# Patient Record
Sex: Male | Born: 1965
Health system: Southern US, Community
[De-identification: ages and names within clinical notes are randomized; demographics above are authoritative.]

## PROBLEM LIST (undated history)

## (undated) ENCOUNTER — Emergency Department: Admission: EM | Source: Home / Self Care

## (undated) DIAGNOSIS — M109 Gout, unspecified: Secondary | ICD-10-CM

## (undated) DIAGNOSIS — I10 Essential (primary) hypertension: Secondary | ICD-10-CM

## (undated) HISTORY — PX: CERVICAL DISC SURGERY: SHX588

## (undated) HISTORY — PX: TONSILLECTOMY AND ADENOIDECTOMY: SUR1326

---

## 2004-11-07 ENCOUNTER — Ambulatory Visit: Payer: Self-pay | Admitting: Orthopedic Surgery

## 2005-07-20 ENCOUNTER — Emergency Department: Payer: Self-pay | Admitting: Unknown Physician Specialty

## 2005-07-20 ENCOUNTER — Other Ambulatory Visit: Payer: Self-pay

## 2007-11-25 DIAGNOSIS — R002 Palpitations: Secondary | ICD-10-CM | POA: Insufficient documentation

## 2007-11-25 DIAGNOSIS — I10 Essential (primary) hypertension: Secondary | ICD-10-CM | POA: Insufficient documentation

## 2007-11-25 DIAGNOSIS — Z720 Tobacco use: Secondary | ICD-10-CM | POA: Insufficient documentation

## 2007-11-25 DIAGNOSIS — F17200 Nicotine dependence, unspecified, uncomplicated: Secondary | ICD-10-CM | POA: Insufficient documentation

## 2009-07-21 DIAGNOSIS — D1730 Benign lipomatous neoplasm of skin and subcutaneous tissue of unspecified sites: Secondary | ICD-10-CM | POA: Insufficient documentation

## 2010-01-24 ENCOUNTER — Ambulatory Visit: Payer: Self-pay | Admitting: Family Medicine

## 2010-01-25 DIAGNOSIS — M5030 Other cervical disc degeneration, unspecified cervical region: Secondary | ICD-10-CM | POA: Insufficient documentation

## 2012-07-22 ENCOUNTER — Emergency Department: Payer: Self-pay | Admitting: Emergency Medicine

## 2012-11-14 ENCOUNTER — Ambulatory Visit: Payer: Self-pay | Admitting: Family

## 2013-01-30 ENCOUNTER — Ambulatory Visit: Payer: Self-pay | Admitting: Neurology

## 2013-02-12 ENCOUNTER — Ambulatory Visit: Payer: Self-pay | Admitting: Neurology

## 2013-02-18 ENCOUNTER — Ambulatory Visit: Payer: Self-pay | Admitting: Neurology

## 2013-03-27 ENCOUNTER — Ambulatory Visit: Payer: Self-pay | Admitting: Neurology

## 2013-05-02 ENCOUNTER — Emergency Department: Payer: Self-pay | Admitting: Emergency Medicine

## 2013-07-31 ENCOUNTER — Ambulatory Visit: Payer: Self-pay | Admitting: Family Medicine

## 2014-06-23 DIAGNOSIS — E538 Deficiency of other specified B group vitamins: Secondary | ICD-10-CM | POA: Insufficient documentation

## 2014-06-23 DIAGNOSIS — G479 Sleep disorder, unspecified: Secondary | ICD-10-CM | POA: Insufficient documentation

## 2014-07-09 ENCOUNTER — Emergency Department: Payer: Self-pay | Admitting: Emergency Medicine

## 2014-07-09 LAB — URINALYSIS, COMPLETE
Bacteria: NONE SEEN
Bilirubin,UR: NEGATIVE
Glucose,UR: NEGATIVE mg/dL (ref 0–75)
KETONE: NEGATIVE
Leukocyte Esterase: NEGATIVE
Nitrite: NEGATIVE
PROTEIN: NEGATIVE
Ph: 5 (ref 4.5–8.0)
SPECIFIC GRAVITY: 1.014 (ref 1.003–1.030)
SQUAMOUS EPITHELIAL: NONE SEEN

## 2014-12-25 LAB — CBC AND DIFFERENTIAL
HCT: 43 % (ref 41–53)
Hemoglobin: 4.7 g/dL — AB (ref 13.5–17.5)
NEUTROS ABS: 66 /uL
PLATELETS: 261 10*3/uL (ref 150–399)
WBC: 11.9 10^3/mL

## 2015-02-05 ENCOUNTER — Other Ambulatory Visit: Payer: Self-pay | Admitting: Internal Medicine

## 2015-02-05 DIAGNOSIS — D3501 Benign neoplasm of right adrenal gland: Secondary | ICD-10-CM

## 2015-03-25 ENCOUNTER — Other Ambulatory Visit: Payer: Self-pay | Admitting: Family Medicine

## 2015-03-28 ENCOUNTER — Other Ambulatory Visit: Payer: Self-pay | Admitting: Family Medicine

## 2015-03-30 ENCOUNTER — Ambulatory Visit: Admission: RE | Admit: 2015-03-30 | Payer: Self-pay | Source: Ambulatory Visit

## 2015-04-13 ENCOUNTER — Ambulatory Visit
Admission: RE | Admit: 2015-04-13 | Discharge: 2015-04-13 | Disposition: A | Discharge status: Home / Self Care | Payer: BLUE CROSS/BLUE SHIELD | Source: Ambulatory Visit | Attending: Internal Medicine | Admitting: Internal Medicine

## 2015-04-13 DIAGNOSIS — D3501 Benign neoplasm of right adrenal gland: Secondary | ICD-10-CM | POA: Diagnosis not present

## 2015-07-28 ENCOUNTER — Other Ambulatory Visit: Payer: Self-pay | Admitting: Family Medicine

## 2015-11-02 ENCOUNTER — Other Ambulatory Visit: Payer: Self-pay | Admitting: Family Medicine

## 2015-11-02 NOTE — Telephone Encounter (Signed)
Pt contacted office for refill request on the following medications: atenolol (TENORMIN) 25 MG tablet & lisinopril (PRINIVIL,ZESTRIL) 20 MG tablet to [facility]. Pt stated after he takes the medication tonight he will be out and would like the RX sent today. I advised that refills can take 24 to 48 hours and Simona Huh is out of the office on Wednesdays. Pt stated that [facility] advised him they sent this before today and we didn't respond. I advised we received it this afternoon for the first time. Thanks TNP

## 2015-12-04 ENCOUNTER — Other Ambulatory Visit: Payer: Self-pay | Admitting: Family Medicine

## 2015-12-15 ENCOUNTER — Other Ambulatory Visit: Payer: Self-pay | Admitting: Internal Medicine

## 2015-12-15 DIAGNOSIS — E279 Disorder of adrenal gland, unspecified: Principal | ICD-10-CM

## 2015-12-15 DIAGNOSIS — E278 Other specified disorders of adrenal gland: Secondary | ICD-10-CM

## 2015-12-19 ENCOUNTER — Emergency Department
Admission: EM | Admit: 2015-12-19 | Discharge: 2015-12-19 | Disposition: A | Discharge status: Home / Self Care | Payer: BLUE CROSS/BLUE SHIELD | Attending: Emergency Medicine | Admitting: Emergency Medicine

## 2015-12-19 ENCOUNTER — Encounter: Payer: Self-pay | Admitting: Emergency Medicine

## 2015-12-19 DIAGNOSIS — F17200 Nicotine dependence, unspecified, uncomplicated: Secondary | ICD-10-CM | POA: Diagnosis not present

## 2015-12-19 DIAGNOSIS — M10071 Idiopathic gout, right ankle and foot: Secondary | ICD-10-CM | POA: Diagnosis not present

## 2015-12-19 DIAGNOSIS — Z79899 Other long term (current) drug therapy: Secondary | ICD-10-CM | POA: Insufficient documentation

## 2015-12-19 DIAGNOSIS — I10 Essential (primary) hypertension: Secondary | ICD-10-CM | POA: Diagnosis not present

## 2015-12-19 DIAGNOSIS — M79674 Pain in right toe(s): Secondary | ICD-10-CM | POA: Diagnosis present

## 2015-12-19 HISTORY — DX: Gout, unspecified: M10.9

## 2015-12-19 HISTORY — DX: Essential (primary) hypertension: I10

## 2015-12-19 MED ORDER — COLCHICINE 0.6 MG PO TABS: 0.6000 mg | ORAL_TABLET | Freq: Every day | ORAL | Status: DC

## 2015-12-19 MED ORDER — KETOROLAC TROMETHAMINE 60 MG/2ML IM SOLN
60.0000 mg | Freq: Once | INTRAMUSCULAR | Status: AC
  Administered 2015-12-19: 60 mg via INTRAMUSCULAR
  Filled 2015-12-19: qty 2

## 2015-12-19 MED ORDER — HYDROMORPHONE HCL 1 MG/ML IJ SOLN
1.0000 mg | Freq: Once | INTRAMUSCULAR | Status: AC
  Administered 2015-12-19: 1 mg via INTRAMUSCULAR
  Filled 2015-12-19: qty 1

## 2015-12-19 MED ORDER — OXYCODONE-ACETAMINOPHEN 7.5-325 MG PO TABS: 1.0000 | ORAL_TABLET | Freq: Four times a day (QID) | ORAL | Status: DC | PRN

## 2015-12-19 NOTE — ED Notes (Signed)
Patient with no complaints at this time. Respirations even and unlabored. Skin warm/dry. Discharge instructions reviewed with patient at this time. Patient given opportunity to voice concerns/ask questions. Patient discharged at this time and left Emergency Department with steady gait.   

## 2015-12-19 NOTE — ED Provider Notes (Signed)
[facility] Emergency Department Provider Note  ____________________________________________  Time seen: Approximately 9:27 PM  I have reviewed the triage vital signs and the nursing notes.   HISTORY  Chief Complaint Gout    HPI Derek [name] is a 50 y.o. male patient states increasing pain and redness to the right hallux. Patient's has a history of gout and is currently taking allopurinol. Patient stated the medicine is not helping. Patient state he's been having increasing pain over the past month. Patient is rating his pain as a 10 over 10. No other palliative measures taken for this complaint.   Past Medical History  Diagnosis Date  . Gout   . Hypertension     There are no active problems to display for this patient.   History reviewed. No pertinent past surgical history.  Current Outpatient Rx  Name  Route  Sig  Dispense  Refill  . ALLOPURINOL PO   Oral   Take by mouth.         . Cyclobenzaprine HCl (FLEXERIL PO)   Oral   Take by mouth.         . Febuxostat (ULORIC PO)   Oral   Take by mouth.         Marland Kitchen atenolol (TENORMIN) 25 MG tablet      TAKE 1 TABLET BY MOUTH EVERY DAY   30 tablet   0     NEED FOLLOW UP APPOINTMENT FOR FURTHER REFILLS.   Marland Kitchen colchicine 0.6 MG tablet   Oral   Take 1 tablet (0.6 mg total) by mouth daily. Day 1, take 2 tablets, wait one hour and take 1 tablet. Then one tablet daily.   30 tablet   0   . lisinopril (PRINIVIL,ZESTRIL) 20 MG tablet      TAKE 1 TABLET BY MOUTH AT BEDTIME   30 tablet   1   . oxyCODONE-acetaminophen (PERCOCET) 7.5-325 MG tablet   Oral   Take 1 tablet by mouth every 6 (six) hours as needed for severe pain.   12 tablet   0     Allergies Review of patient's allergies indicates no known allergies.  No family history on file.  Social History Social History  Substance Use Topics  . Smoking status: Current Some Day Smoker  . Smokeless tobacco: None  . Alcohol Use: Yes     Review of Systems Constitutional: No fever/chills Eyes: No visual changes. ENT: No sore throat. Cardiovascular: Denies chest pain. Respiratory: Denies shortness of breath. Gastrointestinal: No abdominal pain.  No nausea, no vomiting.  No diarrhea.  No constipation. Genitourinary: Negative for dysuria. Musculoskeletal: Pain to the right great toe.  Skin: Negative for rash. Edematous right hallux. Neurological: Negative for headaches, focal weakness or numbness. Endocrine:Hypertension and gout ____________________________________________   PHYSICAL EXAM:  VITAL SIGNS: ED Triage Vitals  Enc Vitals Group     BP 12/19/15 2104 176/109 mmHg     Pulse Rate 12/19/15 2104 85     Resp 12/19/15 2104 18     Temp 12/19/15 2104 98.5 F (36.9 C)     Temp Source 12/19/15 2104 Oral     SpO2 12/19/15 2104 99 %     Weight 12/19/15 2104 181 lb (82.101 kg)     Height 12/19/15 2104 5\' 9"  (1.753 m)     Head Cir --      Peak Flow --      Pain Score 12/19/15 2104 10     Pain Loc --  Pain Edu? --      Excl. in [place]? --     Constitutional: Alert and oriented. Well appearing and in no acute distress. Eyes: Conjunctivae are normal. PERRL. EOMI. Head: Atraumatic. Nose: No congestion/rhinnorhea. Mouth/Throat: Mucous membranes are moist.  Oropharynx non-erythematous. Neck: No stridor. No cervical spine tenderness to palpation. Hematological/Lymphatic/Immunilogical: No cervical lymphadenopathy. Cardiovascular: Normal rate, regular rhythm. Grossly normal heart sounds.  Good peripheral circulation. Elevated  blood pressure Respiratory: Normal respiratory effort.  No retractions. Lungs CTAB. Gastrointestinal: Soft and nontender. No distention. No abdominal bruits. No CVA tenderness. Musculoskeletal: Edematous right hallux.  No joint effusions. Neurologic:  Normal speech and language. No gross focal neurologic deficits are appreciated. No gait instability. Skin:  Skin is warm, dry and intact. No  rash noted. Psychiatric: Mood and affect are normal. Speech and behavior are normal.  ____________________________________________   LABS (all labs ordered are listed, but only abnormal results are displayed)  Labs Reviewed - No data to display ____________________________________________  EKG   ____________________________________________  RADIOLOGY   ____________________________________________   PROCEDURES  Procedure(s) performed: None  Critical Care performed: No  ____________________________________________   INITIAL IMPRESSION / ASSESSMENT AND PLAN / ED COURSE  Pertinent labs & imaging results that were available during my care of the patient were reviewed by me and considered in my medical decision making (see chart for details).  Gout flareup involving the right hallux. Patient given prescription for Percocet and colchicine. Patient advised follow-up family doctor in 2-3 days. ____________________________________________   FINAL CLINICAL IMPRESSION(S) / ED DIAGNOSES  Final diagnoses:  Acute idiopathic gout involving toe of right foot      Sable Feil, PA-C 12/19/15 2148  Harvest Dark, MD 12/19/15 2253

## 2015-12-19 NOTE — ED Notes (Addendum)
Pt presents to ED with c/o of increasing pain regarding hx of gout to right foot. Pt states he has had presenting for approximately x1 month ago, increasing pain since then. Pt states swelling to affected area, warm to touch. Pt alert and oriented x4, ambulatory to treatment room.

## 2015-12-19 NOTE — Discharge Instructions (Signed)
Gout °Gout is when your joints become red, sore, and swell (inflamed). This is caused by the buildup of uric acid crystals in the joints. Uric acid is a chemical that is normally in the blood. If the level of uric acid gets too high in the blood, these crystals form in your joints and tissues. Over time, these crystals can form into masses near the joints and tissues. These masses can destroy bone and cause the bone to look misshapen (deformed). °HOME CARE  °· Do not take aspirin for pain. °· Only take medicine as told by your doctor. °· Rest the joint as much as you can. When in bed, keep sheets and blankets off painful areas. °· Keep the sore joints raised (elevated). °· Put warm or cold packs on painful joints. Use of warm or cold packs depends on which works best for you. °· Use crutches if the painful joint is in your leg. °· Drink enough fluids to keep your pee (urine) clear or pale yellow. Limit alcohol, sugary drinks, and drinks with fructose in them. °· Follow your diet instructions. Pay careful attention to how much protein you eat. Include fruits, vegetables, whole grains, and fat-free or low-fat milk products in your daily diet. Talk to your doctor or dietitian about the use of coffee, vitamin C, and cherries. These may help lower uric acid levels. °· Keep a healthy body weight. °GET HELP RIGHT AWAY IF:  °· You have watery poop (diarrhea), throw up (vomit), or have any side effects from medicines. °· You do not feel better in 24 hours, or you are getting worse. °· Your joint becomes suddenly more tender, and you have chills or a fever. °MAKE SURE YOU:  °· Understand these instructions. °· Will watch your condition. °· Will get help right away if you are not doing well or get worse. °  °This information is not intended to replace advice given to you by your health care provider. Make sure you discuss any questions you have with your health care provider. °  °Document Released: 07/18/2008 Document Revised:  10/30/2014 Document Reviewed: 05/22/2012 °Elsevier Interactive Patient Education ©2016 [employer] ° °

## 2015-12-21 ENCOUNTER — Emergency Department
Admission: EM | Admit: 2015-12-21 | Discharge: 2015-12-21 | Disposition: A | Discharge status: Home / Self Care | Payer: BLUE CROSS/BLUE SHIELD | Attending: Emergency Medicine | Admitting: Emergency Medicine

## 2015-12-21 ENCOUNTER — Ambulatory Visit
Admission: RE | Admit: 2015-12-21 | Discharge: 2015-12-21 | Disposition: A | Discharge status: Home / Self Care | Payer: BLUE CROSS/BLUE SHIELD | Source: Ambulatory Visit | Attending: Internal Medicine | Admitting: Internal Medicine

## 2015-12-21 ENCOUNTER — Encounter: Payer: Self-pay | Admitting: Emergency Medicine

## 2015-12-21 DIAGNOSIS — N200 Calculus of kidney: Secondary | ICD-10-CM | POA: Insufficient documentation

## 2015-12-21 DIAGNOSIS — M109 Gout, unspecified: Secondary | ICD-10-CM

## 2015-12-21 DIAGNOSIS — F17200 Nicotine dependence, unspecified, uncomplicated: Secondary | ICD-10-CM | POA: Insufficient documentation

## 2015-12-21 DIAGNOSIS — M10071 Idiopathic gout, right ankle and foot: Secondary | ICD-10-CM | POA: Insufficient documentation

## 2015-12-21 DIAGNOSIS — E279 Disorder of adrenal gland, unspecified: Secondary | ICD-10-CM | POA: Diagnosis present

## 2015-12-21 DIAGNOSIS — I10 Essential (primary) hypertension: Secondary | ICD-10-CM | POA: Insufficient documentation

## 2015-12-21 DIAGNOSIS — Z79899 Other long term (current) drug therapy: Secondary | ICD-10-CM | POA: Diagnosis not present

## 2015-12-21 DIAGNOSIS — E278 Other specified disorders of adrenal gland: Secondary | ICD-10-CM

## 2015-12-21 DIAGNOSIS — M79671 Pain in right foot: Secondary | ICD-10-CM | POA: Diagnosis present

## 2015-12-21 MED ORDER — PREDNISONE 20 MG PO TABS
ORAL_TABLET | ORAL | Status: AC
  Administered 2015-12-21: 60 mg via ORAL
  Filled 2015-12-21: qty 3

## 2015-12-21 MED ORDER — MORPHINE SULFATE (PF) 4 MG/ML IV SOLN
INTRAVENOUS | Status: AC
  Administered 2015-12-21: 4 mg via INTRAMUSCULAR
  Filled 2015-12-21: qty 1

## 2015-12-21 MED ORDER — PREDNISONE 20 MG PO TABS
60.0000 mg | ORAL_TABLET | Freq: Once | ORAL | Status: AC
  Administered 2015-12-21: 60 mg via ORAL

## 2015-12-21 MED ORDER — PREDNISONE 50 MG PO TABS: 50.0000 mg | ORAL_TABLET | Freq: Every day | ORAL | Status: DC

## 2015-12-21 MED ORDER — MORPHINE SULFATE (PF) 4 MG/ML IV SOLN
4.0000 mg | Freq: Once | INTRAVENOUS | Status: AC
  Administered 2015-12-21: 4 mg via INTRAMUSCULAR

## 2015-12-21 NOTE — ED Notes (Signed)
Patient presents to ED with c/o pain and swelling to right foot x 1 month. Patient reports that the pain became worse since Saturday, patient reports hx of gout. Reports takes medicine for gout. Pt reports was prescribed percocet for pain but states pain medication is not helping. Pt unable to bare weight on foot. Swelling noted to right foot. Pt alert and oriented x 4, no increased work in breathing noted. Skin warm and dry.

## 2015-12-21 NOTE — ED Notes (Signed)
MD at bedside. 

## 2015-12-21 NOTE — ED Notes (Signed)
Pt educated to take home blood pressure medication.  Voiced understanding.

## 2015-12-21 NOTE — ED Provider Notes (Signed)
[facility] Emergency Department Provider Note  ____________________________________________  Time seen: On arrival  I have reviewed the triage vital signs and the nursing notes.   HISTORY  Chief Complaint Foot Pain    HPI Derek [name] is a 50 y.o. male who presents with complaints of right great toe pain. He reports this is a gout flare and was seen in the ED recently but colchicine is not helping his pain. He does take allopurinol for gout. He denies fevers or chills. He reports the pain is severe today and worse than normal.    Past Medical History  Diagnosis Date  . Gout   . Hypertension     There are no active problems to display for this patient.   Past Surgical History  Procedure Laterality Date  . Cervical disc surgery      Current Outpatient Rx  Name  Route  Sig  Dispense  Refill  . ALLOPURINOL PO   Oral   Take by mouth.         Marland Kitchen atenolol (TENORMIN) 25 MG tablet      TAKE 1 TABLET BY MOUTH EVERY DAY   30 tablet   0     NEED FOLLOW UP APPOINTMENT FOR FURTHER REFILLS.   Marland Kitchen colchicine 0.6 MG tablet   Oral   Take 1 tablet (0.6 mg total) by mouth daily. Day 1, take 2 tablets, wait one hour and take 1 tablet. Then one tablet daily.   30 tablet   0   . Cyclobenzaprine HCl (FLEXERIL PO)   Oral   Take by mouth.         . Febuxostat (ULORIC PO)   Oral   Take by mouth.         Marland Kitchen lisinopril (PRINIVIL,ZESTRIL) 20 MG tablet      TAKE 1 TABLET BY MOUTH AT BEDTIME   30 tablet   1   . oxyCODONE-acetaminophen (PERCOCET) 7.5-325 MG tablet   Oral   Take 1 tablet by mouth every 6 (six) hours as needed for severe pain.   12 tablet   0     Allergies Review of patient's allergies indicates no known allergies.  No family history on file.  Social History Social History  Substance Use Topics  . Smoking status: Current Some Day Smoker  . Smokeless tobacco: None  . Alcohol Use: Yes     Comment: occasionally    Review  of Systems  Constitutional: Negative for fever.      Musculoskeletal: Toe pain as above Skin: Redness to toe   ____________________________________________   PHYSICAL EXAM:  VITAL SIGNS: ED Triage Vitals  Enc Vitals Group     BP 12/21/15 2010 168/119 mmHg     Pulse Rate 12/21/15 2010 120     Resp 12/21/15 2010 18     Temp 12/21/15 2010 98.3 F (36.8 C)     Temp Source 12/21/15 2010 Oral     SpO2 12/21/15 2010 96 %     Weight 12/21/15 2010 181 lb (82.101 kg)     Height 12/21/15 2010 5\' 9"  (1.753 m)     Head Cir --      Peak Flow --      Pain Score 12/21/15 2011 10     Pain Loc --      Pain Edu? --      Excl. in [place]? --      Constitutional: Alert and oriented. Well appearing and in no distress. Eyes:  Conjunctivae are normal.  ENT   Head: Normocephalic and atraumatic.   Respiratory: Normal respiratory effort without tachypnea nor retractions.  Gastrointestinal: Soft and non-tender in all quadrants. No distention. There is no CVA tenderness. Musculoskeletal: Mild erythema to the right great toe, no evidence of cellulitis or infection.  Skin:  Skin is warm, dry and intact. No rash noted. Psychiatric: Mood and affect are normal. Patient exhibits appropriate insight and judgment.  ____________________________________________    LABS (pertinent positives/negatives)  Labs Reviewed - No data to display  ____________________________________________     ____________________________________________    RADIOLOGY I have personally reviewed any xrays that were ordered on this patient: None  ____________________________________________   PROCEDURES  Procedure(s) performed: none   ____________________________________________   INITIAL IMPRESSION / ASSESSMENT AND PLAN / ED COURSE  Pertinent labs & imaging results that were available during my care of the patient were reviewed by me and considered in my medical decision making (see chart for  details).  Morphine 4 mg IM given prednisone 60 mg by mouth, recommend follow-up with PCP  ____________________________________________   FINAL CLINICAL IMPRESSION(S) / ED DIAGNOSES  Final diagnoses:  Acute gout of right foot, unspecified cause     Lavonia Drafts, MD 12/21/15 2215

## 2015-12-21 NOTE — ED Notes (Signed)
Patient with pain and swelling to right foot times one month. Patient reports that the pain became worse Saturday and was seen here on Sunday. Patient states that he was given 2 prescriptions but they are not helping the pain. Patient denies any injury.

## 2015-12-21 NOTE — ED Notes (Signed)
Pt discharged to home.  Family member driving.  Discharge instructions reviewed.  Verbalized understanding.  No questions or concerns at this time.  Teach back verified.  Pt in NAD.  No items left in ED.   

## 2015-12-21 NOTE — Discharge Instructions (Signed)

## 2015-12-30 ENCOUNTER — Other Ambulatory Visit: Payer: Self-pay | Admitting: Family Medicine

## 2016-01-03 ENCOUNTER — Ambulatory Visit (INDEPENDENT_AMBULATORY_CARE_PROVIDER_SITE_OTHER): Payer: BLUE CROSS/BLUE SHIELD | Admitting: Family Medicine

## 2016-01-03 ENCOUNTER — Other Ambulatory Visit: Payer: Self-pay | Admitting: Family Medicine

## 2016-01-03 ENCOUNTER — Encounter: Payer: Self-pay | Admitting: Family Medicine

## 2016-01-03 VITALS — BP 132/90 | HR 68 | Temp 98.1°F | Resp 14 | Wt 185.4 lb

## 2016-01-03 DIAGNOSIS — E279 Disorder of adrenal gland, unspecified: Secondary | ICD-10-CM

## 2016-01-03 DIAGNOSIS — Z981 Arthrodesis status: Secondary | ICD-10-CM | POA: Insufficient documentation

## 2016-01-03 DIAGNOSIS — M109 Gout, unspecified: Secondary | ICD-10-CM

## 2016-01-03 DIAGNOSIS — N529 Male erectile dysfunction, unspecified: Secondary | ICD-10-CM | POA: Insufficient documentation

## 2016-01-03 DIAGNOSIS — M7710 Lateral epicondylitis, unspecified elbow: Secondary | ICD-10-CM | POA: Insufficient documentation

## 2016-01-03 DIAGNOSIS — M5020 Other cervical disc displacement, unspecified cervical region: Secondary | ICD-10-CM | POA: Insufficient documentation

## 2016-01-03 DIAGNOSIS — G43909 Migraine, unspecified, not intractable, without status migrainosus: Secondary | ICD-10-CM | POA: Insufficient documentation

## 2016-01-03 DIAGNOSIS — I10 Essential (primary) hypertension: Secondary | ICD-10-CM | POA: Diagnosis not present

## 2016-01-03 DIAGNOSIS — Z8042 Family history of malignant neoplasm of prostate: Secondary | ICD-10-CM | POA: Insufficient documentation

## 2016-01-03 DIAGNOSIS — D3501 Benign neoplasm of right adrenal gland: Secondary | ICD-10-CM | POA: Insufficient documentation

## 2016-01-03 DIAGNOSIS — E278 Other specified disorders of adrenal gland: Secondary | ICD-10-CM

## 2016-01-03 DIAGNOSIS — M5412 Radiculopathy, cervical region: Secondary | ICD-10-CM | POA: Insufficient documentation

## 2016-01-03 DIAGNOSIS — N200 Calculus of kidney: Secondary | ICD-10-CM | POA: Insufficient documentation

## 2016-01-03 MED ORDER — ATENOLOL 50 MG PO TABS: 50.0000 mg | ORAL_TABLET | Freq: Every day | ORAL | Status: DC

## 2016-01-03 MED ORDER — LISINOPRIL 20 MG PO TABS: 20.0000 mg | ORAL_TABLET | Freq: Every day | ORAL | Status: DC

## 2016-01-03 NOTE — Progress Notes (Signed)
Patient ID: Derek [name], male   DOB: 05/31/1966, 50 y.o.   MRN: [id number]   Patient: Derek [name] Male    DOB: 1966/01/16   50 y.o.   MRN: [id number] Visit Date: 01/03/2016  Today's Provider: Vernie Murders, PA   Chief Complaint  Patient presents with  . Hypertension  . Follow-up  . Medication Refill   Subjective:    HPI   Hypertension, follow-up:  BP Readings from Last 3 Encounters:  01/03/16 132/90  12/21/15 153/114  12/19/15 168/95    He was last seen for hypertension 10/19/2014  BP at that visit was 150/100 Management changes since that visit include none. He reports good compliance with treatment. He is not having side effects.  He is exercising. He is adherent to low salt diet.   Outside blood pressures are being checked sometimes. He is experiencing none.  Patient denies none.   Cardiovascular risk factors include male gender.  Use of agents associated with hypertension: none.     Weight trend: stable Wt Readings from Last 3 Encounters:  01/03/16 185 lb 6.4 oz (84.097 kg)  12/21/15 181 lb (82.101 kg)  12/19/15 181 lb (82.101 kg)     ------------------------------------------------------------------------   Patient is requesting refill on Atenolol 25 mg and Lisinopril 20 mg.   Past Medical History  Diagnosis Date  . Gout   . Hypertension    Past Surgical History  Procedure Laterality Date  . Cervical disc surgery    . Tonsillectomy and adenoidectomy     Family History  Problem Relation Age of Onset  . Fibromyalgia Mother   . Prostate cancer Father   . Hypertension Father   . Healthy Sister    Previous Medications   ALLOPURINOL PO    Take by mouth.   ATENOLOL (TENORMIN) 25 MG TABLET    TAKE 1 TABLET BY MOUTH EVERY DAY   COLCHICINE 0.6 MG TABLET    Take 1 tablet (0.6 mg total) by mouth daily. Day 1, take 2 tablets, wait one hour and take 1 tablet. Then one tablet daily.   CYCLOBENZAPRINE HCL (FLEXERIL PO)    Take by mouth.   LISINOPRIL  (PRINIVIL,ZESTRIL) 20 MG TABLET    TAKE 1 TABLET BY MOUTH AT BEDTIME   OXYCODONE-ACETAMINOPHEN (PERCOCET) 7.5-325 MG TABLET    Take 1 tablet by mouth every 6 (six) hours as needed for severe pain.   SILDENAFIL (VIAGRA) 100 MG TABLET    Take by mouth.   No Known Allergies  Review of Systems  Constitutional: Negative.   HENT: Negative.   Eyes: Negative.   Respiratory: Negative.   Cardiovascular: Negative.   Gastrointestinal: Negative.   Endocrine: Negative.   Genitourinary: Negative.   Musculoskeletal: Negative.   Skin: Negative.   Allergic/Immunologic: Negative.   Neurological: Negative.   Hematological: Negative.   Psychiatric/Behavioral: Negative.     Social History  Substance Use Topics  . Smoking status: Current Some Day Smoker  . Smokeless tobacco: Not on file  . Alcohol Use: Yes     Comment: occasionally   Objective:   BP 132/90 mmHg  Pulse 68  Temp(Src) 98.1 F (36.7 C) (Oral)  Resp 14  Wt 185 lb 6.4 oz (84.097 kg)  Physical Exam  Constitutional: He is oriented to person, place, and time. He appears well-developed and well-nourished. No distress.  HENT:  Head: Normocephalic and atraumatic.  Right Ear: Hearing normal.  Left Ear: Hearing normal.  Nose: Nose normal.  Eyes: Conjunctivae and lids are  normal. Right eye exhibits no discharge. Left eye exhibits no discharge. No scleral icterus.  Neck: Normal range of motion. Neck supple.  Cardiovascular: Normal rate, regular rhythm and normal heart sounds.   Pulmonary/Chest: Effort normal and breath sounds normal. No respiratory distress.  Abdominal: Soft. Bowel sounds are normal.  Musculoskeletal: Normal range of motion.  Neurological: He is alert and oriented to person, place, and time.  Skin: Skin is intact. No lesion and no rash noted.  Psychiatric: He has a normal mood and affect. His speech is normal and behavior is normal. Thought content normal.      Assessment & Plan:     1. Essential (primary)  hypertension Slight elevation of diastolic pressure. Better than check in the ER on 12-21-15 while having severe gout attack. Normal renal function and adrenal function tests 12-09-15. Will increase Tenormin to 50 mg qd and check BP at home. Call the office if readings don't normalize in the next 2 weeks.  - atenolol (TENORMIN) 50 MG tablet; Take 1 tablet (50 mg total) by mouth daily.  Dispense: 30 tablet; Refill: 6 - lisinopril (PRINIVIL,ZESTRIL) 20 MG tablet; Take 1 tablet (20 mg total) by mouth at bedtime.  Dispense: 30 tablet; Refill: 6  2. Adrenal mass ([place]) Evaluated by Dr. Gabriel Carina with check of metanephrines, cortisol  and aldosterone/renin ratio (all normal). CT scan showed bilateral renal stone (non-obstructing) and benign small adrenal adenoma on 12-21-15. Continue follow up with Dr. Gabriel Carina (endocrinologist) as planned.  3. Gout of multiple sites, unspecified cause, unspecified chronicity Severe acute flare in the right foot toe the end of Feb. 2017. Had no relief with Percocet or Morphine with the Allopurinol. Finally relieved with use of Colchicine and Prednisone. Will have follow up and probable labs, with Dr.Kernodle (rheumatologist) in 2 days. Encouraged to stop ETOH intake and follow purine restricted diet.

## 2016-03-09 ENCOUNTER — Telehealth: Payer: Self-pay | Admitting: Family Medicine

## 2016-03-09 NOTE — Telephone Encounter (Signed)
Pt's mother Quinten Witting) called requesting referral for pt to see Dr Kathyrn Sheriff for snoring.Call back # [phone]

## 2016-03-10 NOTE — Telephone Encounter (Signed)
Called patient to see if he needed referral for snoring. States he is not aware of any problems. Offered to see him or consider referral for sleep study if having issues.

## 2016-03-10 NOTE — Telephone Encounter (Signed)
Please review-aa 

## 2016-03-14 ENCOUNTER — Encounter: Payer: Self-pay | Admitting: Family Medicine

## 2016-03-14 ENCOUNTER — Ambulatory Visit (INDEPENDENT_AMBULATORY_CARE_PROVIDER_SITE_OTHER): Payer: BLUE CROSS/BLUE SHIELD | Admitting: Family Medicine

## 2016-03-14 VITALS — BP 148/92 | HR 68 | Temp 98.3°F | Resp 20 | Wt 185.0 lb

## 2016-03-14 DIAGNOSIS — W57XXXA Bitten or stung by nonvenomous insect and other nonvenomous arthropods, initial encounter: Secondary | ICD-10-CM

## 2016-03-14 DIAGNOSIS — T148 Other injury of unspecified body region: Secondary | ICD-10-CM | POA: Diagnosis not present

## 2016-03-14 DIAGNOSIS — G4730 Sleep apnea, unspecified: Secondary | ICD-10-CM

## 2016-03-14 MED ORDER — DOXYCYCLINE HYCLATE 100 MG PO TABS: 100.0000 mg | ORAL_TABLET | Freq: Two times a day (BID) | ORAL | Status: DC

## 2016-03-14 NOTE — Progress Notes (Signed)
Patient ID: Derek [name], male   DOB: September 13, 1966, 50 y.o.   MRN: [id number]       Patient: Derek [name] Male    DOB: [birth year]   50 y.o.   MRN: [id number] Visit Date: 03/14/2016  Today's Provider: Vernie Murders, PA   Chief Complaint  Patient presents with  . Insect Bite   Subjective:    HPI  Patient states he had a tick bite on the right side of the shaft of the penis. He saw it this morning and it took him 10 minutes to pull it off. Area is tender, red and swollen. No blood or discharge present. Some discomfort in the right groin area also.   Patient Active Problem List   Diagnosis Date Noted  . Adrenal mass ([place]) 01/03/2016  . Cervical nerve root disorder 01/03/2016  . Displacement of cervical intervertebral disc without myelopathy 01/03/2016  . Failure of erection 01/03/2016  . Family history of malignant neoplasm of prostate 01/03/2016  . H/O arthrodesis 01/03/2016  . Acute gouty arthritis 01/03/2016  . Headache, migraine 01/03/2016  . Calculus of kidney 01/03/2016  . Backhand tennis elbow 01/03/2016  . Disordered sleep 06/23/2014  . B12 deficiency 06/23/2014  . Degeneration of cervical intervertebral disc 01/25/2010  . Lipoma of skin 07/21/2009  . Essential (primary) hypertension 11/25/2007  . Awareness of heartbeats 11/25/2007  . Compulsive tobacco user syndrome 11/25/2007   Past Surgical History  Procedure Laterality Date  . Cervical disc surgery    . Tonsillectomy and adenoidectomy     Family History  Problem Relation Age of Onset  . Fibromyalgia Mother   . Prostate cancer Father   . Hypertension Father   . Healthy Sister    No Known Allergies   Previous Medications   ALLOPURINOL PO    Take by mouth.   ATENOLOL (TENORMIN) 50 MG TABLET    Take 1 tablet (50 mg total) by mouth daily.   COLCHICINE 0.6 MG TABLET    Take 1 tablet (0.6 mg total) by mouth daily. Day 1, take 2 tablets, wait one hour and take 1 tablet. Then one tablet daily.   CYCLOBENZAPRINE  HCL (FLEXERIL PO)    Take by mouth.   LISINOPRIL (PRINIVIL,ZESTRIL) 20 MG TABLET    Take 1 tablet (20 mg total) by mouth at bedtime.   OXYCODONE-ACETAMINOPHEN (PERCOCET) 7.5-325 MG TABLET    Take 1 tablet by mouth every 6 (six) hours as needed for severe pain.   SILDENAFIL (VIAGRA) 100 MG TABLET    Take by mouth.    Review of Systems  Constitutional: Negative.   Respiratory: Negative.   Cardiovascular: Negative.   Genitourinary: Positive for penile swelling and penile pain.  Skin: Positive for rash and wound.    Social History  Substance Use Topics  . Smoking status: Current Some Day Smoker  . Smokeless tobacco: Not on file  . Alcohol Use: Yes     Comment: occasionally   Objective:   BP 148/92 mmHg  Pulse 68  Temp(Src) 98.3 F (36.8 C)  Resp 20  Wt 185 lb (83.915 kg) BP Readings from Last 3 Encounters:  03/14/16 148/92  01/03/16 132/90  12/21/15 153/114    Physical Exam  Constitutional: He is oriented to person, place, and time. He appears well-developed and well-nourished. No distress.  HENT:  Head: Normocephalic and atraumatic.  Right Ear: Hearing normal.  Left Ear: Hearing normal.  Nose: Nose normal.  Eyes: Conjunctivae and lids are normal. Right eye exhibits  no discharge. Left eye exhibits no discharge. No scleral icterus.  Cardiovascular: Normal rate and regular rhythm.   Pulmonary/Chest: Effort normal and breath sounds normal. No respiratory distress.  Musculoskeletal: Normal range of motion.  Neurological: He is alert and oriented to person, place, and time.  Skin: Skin is intact. Rash noted. No lesion noted.  Large pink whelp on proximal ventral shaft of penis with pruritis and tender slightly enlarged right inguinal lymph node. Smaller whelps on lower abdomen - midline.  Psychiatric: He has a normal mood and affect. His speech is normal and behavior is normal. Thought content normal.      Assessment & Plan:     1. Tick bite Onset over the past couple  days. Pulled an attached tick off the proximal shaft of the penis. Having rash on lower abdomen and tender, enlarged right inguinal lymph nodes. No fever. Will treat with antibiotic and get lab tests. May use Hydrocortisone or Benadryl for itching whelps. Recheck pending reports. - doxycycline (VIBRA-TABS) 100 MG tablet; Take 1 tablet (100 mg total) by mouth 2 (two) times daily.  Dispense: 20 tablet; Refill: 0 - Lyme Ab/Western Blot Reflex - CBC with Differential/Platelet  2. Sleep apnea Having fatigue and severe snoring (girlfriend won't let him sleep in the same room with her). Had a polysomnogram in May 2014 done by Dr. Melrose Nakayama (neurologist) when he was evaluating headaches. Reported obstructive sleep apnea. Patient's anxiety level did not allow him to try the CPAP. Will try to get copy of the sleep study and consider referral to ENT surgeon.        Vernie Murders, PA  [facility]

## 2016-03-14 NOTE — Patient Instructions (Signed)
Tick Bite Information Ticks are insects that attach themselves to the skin and draw blood for food. There are various types of ticks. Common types include wood ticks and deer ticks. Most ticks live in shrubs and grassy areas. Ticks can climb onto your body when you make contact with leaves or grass where the tick is waiting. The most common places on the body for ticks to attach themselves are the scalp, neck, armpits, waist, and groin. Most tick bites are harmless, but sometimes ticks carry germs that cause diseases. These germs can be spread to a person during the tick's feeding process. The chance of a disease spreading through a tick bite depends on:   The type of tick.  Time of year.   How long the tick is attached.   Geographic location.  HOW CAN YOU PREVENT TICK BITES? Take these steps to help prevent tick bites when you are outdoors:  Wear protective clothing. Long sleeves and long pants are best.   Wear white clothes so you can see ticks more easily.  Tuck your pant legs into your socks.   If walking on a trail, stay in the middle of the trail to avoid brushing against bushes.  Avoid walking through areas with long grass.  Put insect repellent on all exposed skin and along boot tops, pant legs, and sleeve cuffs.   Check clothing, hair, and skin repeatedly and before going inside.   Brush off any ticks that are not attached.  Take a shower or bath as soon as possible after being outdoors.  WHAT IS THE PROPER WAY TO REMOVE A TICK? Ticks should be removed as soon as possible to help prevent diseases caused by tick bites. 1. If latex gloves are available, put them on before trying to remove a tick.  2. Using fine-point tweezers, grasp the tick as close to the skin as possible. You may also use curved forceps or a tick removal tool. Grasp the tick as close to its head as possible. Avoid grasping the tick on its body. 3. Pull gently with steady upward pressure until  the tick lets go. Do not twist the tick or jerk it suddenly. This may break off the tick's head or mouth parts. 4. Do not squeeze or crush the tick's body. This could force disease-carrying fluids from the tick into your body.  5. After the tick is removed, wash the bite area and your hands with soap and water or other disinfectant such as alcohol. 6. Apply a small amount of antiseptic cream or ointment to the bite site.  7. Wash and disinfect any instruments that were used.  Do not try to remove a tick by applying a hot match, petroleum jelly, or fingernail polish to the tick. These methods do not work and may increase the chances of disease being spread from the tick bite.  WHEN SHOULD YOU SEEK MEDICAL CARE? Contact your health care provider if you are unable to remove a tick from your skin or if a part of the tick breaks off and is stuck in the skin.  After a tick bite, you need to be aware of signs and symptoms that could be related to diseases spread by ticks. Contact your health care provider if you develop any of the following in the days or weeks after the tick bite:  Unexplained fever.  Rash. A circular rash that appears days or weeks after the tick bite may indicate the possibility of Lyme disease. The rash may resemble   a target with a bull's-eye and may occur at a different part of your body than the tick bite.  Redness and swelling in the area of the tick bite.   Tender, swollen lymph glands.   Diarrhea.   Weight loss.   Cough.   Fatigue.   Muscle, joint, or bone pain.   Abdominal pain.   Headache.   Lethargy or a change in your level of consciousness.  Difficulty walking or moving your legs.   Numbness in the legs.   Paralysis.  Shortness of breath.   Confusion.   Repeated vomiting.    This information is not intended to replace advice given to you by your health care provider. Make sure you discuss any questions you have with your health  care provider.   Document Released: 10/06/2000 Document Revised: 10/30/2014 Document Reviewed: 03/19/2013 Elsevier Interactive Patient Education 2016 [employer]  

## 2016-03-16 LAB — CBC WITH DIFFERENTIAL/PLATELET
Basophils Absolute: 0 10*3/uL (ref 0.0–0.2)
Basos: 0 %
EOS (ABSOLUTE): 0.6 10*3/uL — ABNORMAL HIGH (ref 0.0–0.4)
EOS: 6 %
HEMATOCRIT: 38.3 % (ref 37.5–51.0)
HEMOGLOBIN: 13.1 g/dL (ref 12.6–17.7)
IMMATURE GRANS (ABS): 0 10*3/uL (ref 0.0–0.1)
Immature Granulocytes: 0 %
LYMPHS ABS: 1.8 10*3/uL (ref 0.7–3.1)
Lymphs: 17 %
MCH: 31.5 pg (ref 26.6–33.0)
MCHC: 34.2 g/dL (ref 31.5–35.7)
MCV: 92 fL (ref 79–97)
MONOCYTES: 9 %
Monocytes Absolute: 0.9 10*3/uL (ref 0.1–0.9)
NEUTROS ABS: 6.9 10*3/uL (ref 1.4–7.0)
Neutrophils: 68 %
Platelets: 244 10*3/uL (ref 150–379)
RBC: 4.16 x10E6/uL (ref 4.14–5.80)
RDW: 13.8 % (ref 12.3–15.4)
WBC: 10.2 10*3/uL (ref 3.4–10.8)

## 2016-03-16 LAB — LYME AB/WESTERN BLOT REFLEX
LYME DISEASE AB, QUANT, IGM: <0.8 index (ref 0.00–0.79)
Lyme IgG/IgM Ab: <0.91 {ISR} (ref 0.00–0.90)

## 2016-03-17 ENCOUNTER — Encounter: Payer: Self-pay | Admitting: Family Medicine

## 2016-03-17 ENCOUNTER — Other Ambulatory Visit: Payer: Self-pay | Admitting: Emergency Medicine

## 2016-03-17 DIAGNOSIS — G4730 Sleep apnea, unspecified: Secondary | ICD-10-CM

## 2016-03-22 ENCOUNTER — Telehealth: Payer: Self-pay | Admitting: Family Medicine

## 2016-03-22 NOTE — Telephone Encounter (Signed)
Pt is requesting results of lab work.

## 2016-03-28 ENCOUNTER — Other Ambulatory Visit: Payer: Self-pay | Admitting: Family Medicine

## 2016-04-06 ENCOUNTER — Encounter: Payer: Self-pay | Admitting: Family Medicine

## 2016-05-08 DIAGNOSIS — G4733 Obstructive sleep apnea (adult) (pediatric): Secondary | ICD-10-CM | POA: Diagnosis not present

## 2016-06-07 ENCOUNTER — Ambulatory Visit: Payer: BLUE CROSS/BLUE SHIELD | Attending: Otolaryngology

## 2016-06-07 DIAGNOSIS — R0683 Snoring: Secondary | ICD-10-CM | POA: Insufficient documentation

## 2016-06-07 DIAGNOSIS — G4761 Periodic limb movement disorder: Secondary | ICD-10-CM | POA: Insufficient documentation

## 2016-06-07 DIAGNOSIS — G4733 Obstructive sleep apnea (adult) (pediatric): Secondary | ICD-10-CM | POA: Diagnosis not present

## 2016-07-03 ENCOUNTER — Ambulatory Visit: Payer: BLUE CROSS/BLUE SHIELD | Admitting: Family Medicine

## 2016-07-03 NOTE — Progress Notes (Deleted)
   Patient: Derek [name] Male    DOB: Feb 06, 1966   50 y.o.   MRN: [id number] Visit Date: 07/03/2016  Today's Provider: Vernie Murders, PA   No chief complaint on file.  Subjective:    HPI  Hypertension, follow-up:  BP Readings from Last 3 Encounters:  03/14/16 (!) 148/92  01/03/16 132/90  12/21/15 (!) 153/114    He was last seen for hypertension 6 months ago.  BP at that visit was 132/90. Management changes since that visit include increased Tenormin to 5 mg. He reports excellent compliance with treatment. He is not having side effects.  He {is/is not:9024} exercising. He {is/is not:9024} adherent to low salt diet.   Outside blood pressures are ***. He is experiencing none.  Patient denies none.   Cardiovascular risk factors include hypertension and male gender.  Use of agents associated with hypertension: none.     Weight trend: stable Wt Readings from Last 3 Encounters:  03/14/16 185 lb (83.9 kg)  01/03/16 185 lb 6.4 oz (84.1 kg)  12/21/15 181 lb (82.1 kg)    Current diet: {diet habits:16563}  ------------------------------------------------------------------------   Gout Follow up: 6 month follow. Stable   Previous Medications   ALLOPURINOL PO    Take by mouth.   ATENOLOL (TENORMIN) 50 MG TABLET    Take 1 tablet (50 mg total) by mouth daily.   COLCHICINE 0.6 MG TABLET    Take 1 tablet (0.6 mg total) by mouth daily. Day 1, take 2 tablets, wait one hour and take 1 tablet. Then one tablet daily.   CYCLOBENZAPRINE HCL (FLEXERIL PO)    Take by mouth.   DOXYCYCLINE (VIBRA-TABS) 100 MG TABLET    Take 1 tablet (100 mg total) by mouth 2 (two) times daily.   LISINOPRIL (PRINIVIL,ZESTRIL) 20 MG TABLET    Take 1 tablet (20 mg total) by mouth at bedtime.   OXYCODONE-ACETAMINOPHEN (PERCOCET) 7.5-325 MG TABLET    Take 1 tablet by mouth every 6 (six) hours as needed for severe pain.   SILDENAFIL (VIAGRA) 100 MG TABLET    Take by mouth.    Review of Systems    Constitutional: Negative.   Respiratory: Negative.   Cardiovascular: Negative.   Musculoskeletal: Negative.     Social History  Substance Use Topics  . Smoking status: Current Some Day Smoker  . Smokeless tobacco: Not on file  . Alcohol use Yes     Comment: occasionally   Objective:   There were no vitals taken for this visit.  Physical Exam      Assessment & Plan:       Follow up: No Follow-up on file.

## 2016-07-12 ENCOUNTER — Ambulatory Visit: Payer: BLUE CROSS/BLUE SHIELD | Attending: Otolaryngology

## 2016-07-26 ENCOUNTER — Ambulatory Visit: Payer: BLUE CROSS/BLUE SHIELD | Attending: Otolaryngology

## 2016-07-26 DIAGNOSIS — R0683 Snoring: Secondary | ICD-10-CM | POA: Insufficient documentation

## 2016-07-26 DIAGNOSIS — G4733 Obstructive sleep apnea (adult) (pediatric): Secondary | ICD-10-CM | POA: Diagnosis not present

## 2016-07-29 ENCOUNTER — Other Ambulatory Visit: Payer: Self-pay | Admitting: Family Medicine

## 2016-07-29 DIAGNOSIS — I10 Essential (primary) hypertension: Secondary | ICD-10-CM

## 2016-09-12 DIAGNOSIS — G4733 Obstructive sleep apnea (adult) (pediatric): Secondary | ICD-10-CM | POA: Diagnosis not present

## 2016-09-21 ENCOUNTER — Telehealth: Payer: Self-pay

## 2016-09-21 DIAGNOSIS — I10 Essential (primary) hypertension: Secondary | ICD-10-CM

## 2016-09-21 MED ORDER — LISINOPRIL 20 MG PO TABS: 20.0000 mg | ORAL_TABLET | Freq: Every day | ORAL | 0 refills | Status: DC

## 2016-09-21 MED ORDER — ATENOLOL 50 MG PO TABS: 50.0000 mg | ORAL_TABLET | Freq: Every day | ORAL | 0 refills | Status: DC

## 2016-09-21 NOTE — Telephone Encounter (Signed)
Sent 90 day refill of the Tenormin and Lisinopril. Must schedule follow up appointment with lab work in the next 4-6 weeks.

## 2016-09-21 NOTE — Telephone Encounter (Signed)
Patient advised. Patient states he will call back to schedule a follow up appointment.

## 2016-09-21 NOTE — Telephone Encounter (Signed)
[facility] received from [facility] requesting a 90 days supply of atenolol (TENORMIN) 50 MG tablet and lisinopril (PRINIVIL,ZESTRIL) 20 MG tablet

## 2017-01-09 ENCOUNTER — Ambulatory Visit (INDEPENDENT_AMBULATORY_CARE_PROVIDER_SITE_OTHER): Payer: BLUE CROSS/BLUE SHIELD | Admitting: Family Medicine

## 2017-01-09 ENCOUNTER — Encounter: Payer: Self-pay | Admitting: Family Medicine

## 2017-01-09 ENCOUNTER — Other Ambulatory Visit: Payer: Self-pay

## 2017-01-09 VITALS — BP 138/96 | HR 62 | Temp 97.9°F | Wt 193.0 lb

## 2017-01-09 DIAGNOSIS — M109 Gout, unspecified: Secondary | ICD-10-CM | POA: Diagnosis not present

## 2017-01-09 DIAGNOSIS — I10 Essential (primary) hypertension: Secondary | ICD-10-CM

## 2017-01-09 DIAGNOSIS — N529 Male erectile dysfunction, unspecified: Secondary | ICD-10-CM | POA: Diagnosis not present

## 2017-01-09 MED ORDER — ATENOLOL 50 MG PO TABS: 50.0000 mg | ORAL_TABLET | Freq: Every day | ORAL | 3 refills | Status: DC

## 2017-01-09 MED ORDER — SILDENAFIL CITRATE 100 MG PO TABS: ORAL_TABLET | ORAL | 2 refills | Status: DC

## 2017-01-09 MED ORDER — LISINOPRIL 20 MG PO TABS: 30.0000 mg | ORAL_TABLET | Freq: Every day | ORAL | 3 refills | Status: DC

## 2017-01-09 NOTE — Progress Notes (Signed)
Patient: Derek [name] Male    DOB: 05-28-1966   51 y.o.   MRN: [id number] Visit Date: 01/09/2017  Today's Provider: Vernie Murders, PA   Chief Complaint  Patient presents with  . Hypertension  . Gout  . Follow-up   Subjective:    HPI  Hypertension, follow-up:  BP Readings from Last 3 Encounters:  01/09/17 (!) 138/96  03/14/16 (!) 148/92  01/03/16 132/90    He was last seen for hypertension 1 years ago.  BP at that visit was 132/90 Management changes since that visit include increased Tenormin to 50 mg and advised to check BP at home. He reports good compliance with treatment. He is not having side effects, but still has elevated blood pressure at times. He is exercising. He is adherent to low salt diet.   Outside blood pressures are being not being checked. He is experiencing none.  Patient denies chest pain, chest pressure/discomfort, irregular heart beat and palpitations.   Cardiovascular risk factors include hypertension and male gender.  Use of agents associated with hypertension: none.     Weight trend: stable Wt Readings from Last 3 Encounters:  01/09/17 193 lb (87.5 kg)  03/14/16 185 lb (83.9 kg)  01/03/16 185 lb 6.4 oz (84.1 kg)    Current diet: in general, a "healthy" diet    ------------------------------------------------------------------------ Past Medical History:  Diagnosis Date  . Gout   . Hypertension    Patient Active Problem List   Diagnosis Date Noted  . Adrenal mass ([place]) 01/03/2016  . Cervical nerve root disorder 01/03/2016  . Displacement of cervical intervertebral disc without myelopathy 01/03/2016  . Failure of erection 01/03/2016  . Family history of malignant neoplasm of prostate 01/03/2016  . H/O arthrodesis 01/03/2016  . Acute gouty arthritis 01/03/2016  . Headache, migraine 01/03/2016  . Calculus of kidney 01/03/2016  . Backhand tennis elbow 01/03/2016  . Disordered sleep 06/23/2014  . B12 deficiency 06/23/2014  .  Degeneration of cervical intervertebral disc 01/25/2010  . Lipoma of skin 07/21/2009  . Essential (primary) hypertension 11/25/2007  . Awareness of heartbeats 11/25/2007  . Compulsive tobacco user syndrome 11/25/2007   Past Surgical History:  Procedure Laterality Date  . CERVICAL DISC SURGERY    . TONSILLECTOMY AND ADENOIDECTOMY     Family History  Problem Relation Age of Onset  . Fibromyalgia Mother   . Prostate cancer Father   . Hypertension Father   . Healthy Sister    No Known Allergies   Previous Medications   ALLOPURINOL (ZYLOPRIM) 100 MG TABLET    TAKE 2 TABLETS (200 MG TOTAL) BY MOUTH ONCE DAILY.   ATENOLOL (TENORMIN) 50 MG TABLET    Take 1 tablet (50 mg total) by mouth daily.   COLCHICINE 0.6 MG TABLET    Take 1 tablet (0.6 mg total) by mouth daily. Day 1, take 2 tablets, wait one hour and take 1 tablet. Then one tablet daily.   LISINOPRIL (PRINIVIL,ZESTRIL) 20 MG TABLET    Take 1 tablet (20 mg total) by mouth at bedtime.   SILDENAFIL (VIAGRA) 100 MG TABLET    Take by mouth.    Review of Systems  Constitutional: Negative.   Respiratory: Negative.   Cardiovascular: Negative.     Social History  Substance Use Topics  . Smoking status: Current Some Day Smoker  . Smokeless tobacco: Never Used  . Alcohol use Yes     Comment: occasionally   Objective:   BP (!) 138/96 (BP Location: Right Arm,  Patient Position: Sitting, Cuff Size: Normal)   Pulse 62   Temp 97.9 F (36.6 C) (Oral)   Wt 193 lb (87.5 kg)   SpO2 97%   BMI 28.50 kg/m   Physical Exam  Constitutional: He is oriented to person, place, and time. He appears well-developed and well-nourished.  HENT:  Head: Normocephalic.  Right Ear: External ear normal.  Left Ear: External ear normal.  Nose: Nose normal.  Mouth/Throat: Oropharynx is clear and moist.  Eyes: Conjunctivae are normal.  Neck: Neck supple.  Cardiovascular: Normal rate, regular rhythm and normal heart sounds.   Pulmonary/Chest: Effort  normal and breath sounds normal.  Abdominal: Soft. Bowel sounds are normal.  Musculoskeletal: Normal range of motion.  Lymphadenopathy:    He has no cervical adenopathy.  Neurological: He is alert and oriented to person, place, and time.  Psychiatric: He has a normal mood and affect. His behavior is normal. Thought content normal.      Assessment & Plan:     1. Essential (primary) hypertension Asymptomatic but slightly elevated. States his dentist noticed it was high recently. No side effects from medications. Will increase Lisinopril to 30 mg qd and continue Tenormin 50 mg qd. Recheck routine labs and follow up pending reports. States he will be getting his CPAP soon. Snoring still very loud and girlfriend has moved out. - atenolol (TENORMIN) 50 MG tablet; Take 1 tablet (50 mg total) by mouth daily.  Dispense: 90 tablet; Refill: 3 - lisinopril (PRINIVIL,ZESTRIL) 20 MG tablet; Take 1.5 tablets (30 mg total) by mouth at bedtime.  Dispense: 135 tablet; Refill: 3 - CBC with Differential/Platelet - Comprehensive metabolic panel  2. Gout, unspecified cause, unspecified chronicity, unspecified site No attacks in the past year. Allopurinol 200 mg qd has helped to prevent attacks. Recheck labs and follow up pending reports. - CBC with Differential/Platelet - Comprehensive metabolic panel - Uric acid  3. Erectile dysfunction, unspecified erectile dysfunction type Still finds the intermittent use of the Viagra helps erections last throughout intercourse and easier to attain. Will refill prescription. - sildenafil (VIAGRA) 100 MG tablet; Take one tablet by mouth 1-4 hours prior to intercourse. Limit one tablet per day.  Dispense: 10 tablet; Refill: 2

## 2017-01-10 LAB — COMPREHENSIVE METABOLIC PANEL
ALBUMIN: 4.6 g/dL (ref 3.5–5.5)
ALK PHOS: 48 IU/L (ref 39–117)
ALT: 20 IU/L (ref 0–44)
AST: 21 IU/L (ref 0–40)
Albumin/Globulin Ratio: 2 (ref 1.2–2.2)
BUN/Creatinine Ratio: 13 (ref 9–20)
BUN: 12 mg/dL (ref 6–24)
Bilirubin Total: 0.2 mg/dL (ref 0.0–1.2)
CALCIUM: 9.3 mg/dL (ref 8.7–10.2)
CO2: 24 mmol/L (ref 18–29)
CREATININE: 0.95 mg/dL (ref 0.76–1.27)
Chloride: 105 mmol/L (ref 96–106)
GFR calc Af Amer: 107 mL/min/{1.73_m2} (ref >59)
GFR, EST NON AFRICAN AMERICAN: 93 mL/min/{1.73_m2} (ref >59)
GLOBULIN, TOTAL: 2.3 g/dL (ref 1.5–4.5)
Glucose: 92 mg/dL (ref 65–99)
Potassium: 4.7 mmol/L (ref 3.5–5.2)
Sodium: 143 mmol/L (ref 134–144)
TOTAL PROTEIN: 6.9 g/dL (ref 6.0–8.5)

## 2017-01-10 LAB — CBC WITH DIFFERENTIAL/PLATELET
Basophils Absolute: 0 10*3/uL (ref 0.0–0.2)
Basos: 0 %
EOS (ABSOLUTE): 0.7 10*3/uL — ABNORMAL HIGH (ref 0.0–0.4)
EOS: 8 %
HEMATOCRIT: 41.2 % (ref 37.5–51.0)
HEMOGLOBIN: 13.7 g/dL (ref 13.0–17.7)
IMMATURE GRANS (ABS): 0 10*3/uL (ref 0.0–0.1)
Immature Granulocytes: 0 %
LYMPHS ABS: 3.2 10*3/uL — AB (ref 0.7–3.1)
LYMPHS: 36 %
MCH: 31.2 pg (ref 26.6–33.0)
MCHC: 33.3 g/dL (ref 31.5–35.7)
MCV: 94 fL (ref 79–97)
Monocytes Absolute: 0.7 10*3/uL (ref 0.1–0.9)
Monocytes: 8 %
Neutrophils Absolute: 4.2 10*3/uL (ref 1.4–7.0)
Neutrophils: 48 %
Platelets: 238 10*3/uL (ref 150–379)
RBC: 4.39 x10E6/uL (ref 4.14–5.80)
RDW: 13.5 % (ref 12.3–15.4)
WBC: 8.9 10*3/uL (ref 3.4–10.8)

## 2017-01-10 LAB — URIC ACID: Uric Acid: 5.5 mg/dL (ref 3.7–8.6)

## 2017-01-11 ENCOUNTER — Telehealth: Payer: Self-pay

## 2017-01-11 NOTE — Telephone Encounter (Signed)
Patient advised and verbalized understanding. Patient wants to call back to schedule follow up appointment.

## 2017-01-11 NOTE — Telephone Encounter (Signed)
-----   Message from Margo Common, [place] sent at 01/11/2017  8:59 AM EDT ----- Blood tests in great shape. Uric acid is below 6.0 on the Allopurinol to prevent gout attacks. Try the CPAP to see if it will help lower the BP those last few points. Recheck progress in 3-4 months.

## 2017-03-01 DIAGNOSIS — G4733 Obstructive sleep apnea (adult) (pediatric): Secondary | ICD-10-CM | POA: Diagnosis not present

## 2017-04-01 DIAGNOSIS — G4733 Obstructive sleep apnea (adult) (pediatric): Secondary | ICD-10-CM | POA: Diagnosis not present

## 2017-05-01 DIAGNOSIS — G4733 Obstructive sleep apnea (adult) (pediatric): Secondary | ICD-10-CM | POA: Diagnosis not present

## 2018-01-03 ENCOUNTER — Other Ambulatory Visit: Payer: Self-pay | Admitting: Family Medicine

## 2018-01-03 DIAGNOSIS — I10 Essential (primary) hypertension: Secondary | ICD-10-CM

## 2018-01-03 MED ORDER — LISINOPRIL 20 MG PO TABS: 30.0000 mg | ORAL_TABLET | Freq: Every day | ORAL | 3 refills | Status: DC

## 2018-01-03 NOTE — Telephone Encounter (Signed)
Patient has 2 pills of Lisinopril left.   He has an appt. On Tuesday w/ Simona Huh for f/u.  Can you call him in a refill so he wont go without to [place]   Please let patient know if you do this.

## 2018-01-03 NOTE — Telephone Encounter (Signed)
Sent refill to [facility]

## 2018-01-04 NOTE — Telephone Encounter (Signed)
Left patient a message advising him that [facility].

## 2018-01-08 ENCOUNTER — Ambulatory Visit: Payer: BLUE CROSS/BLUE SHIELD | Admitting: Family Medicine

## 2018-01-08 ENCOUNTER — Encounter: Payer: Self-pay | Admitting: Family Medicine

## 2018-01-08 VITALS — BP 142/98 | HR 82 | Temp 97.9°F | Ht 69.0 in | Wt 193.0 lb

## 2018-01-08 DIAGNOSIS — I10 Essential (primary) hypertension: Secondary | ICD-10-CM

## 2018-01-08 DIAGNOSIS — Z114 Encounter for screening for human immunodeficiency virus [HIV]: Secondary | ICD-10-CM | POA: Diagnosis not present

## 2018-01-08 DIAGNOSIS — Z981 Arthrodesis status: Secondary | ICD-10-CM

## 2018-01-08 DIAGNOSIS — M109 Gout, unspecified: Secondary | ICD-10-CM | POA: Diagnosis not present

## 2018-01-08 DIAGNOSIS — F17200 Nicotine dependence, unspecified, uncomplicated: Secondary | ICD-10-CM | POA: Diagnosis not present

## 2018-01-08 DIAGNOSIS — Z23 Encounter for immunization: Secondary | ICD-10-CM | POA: Diagnosis not present

## 2018-01-08 DIAGNOSIS — N529 Male erectile dysfunction, unspecified: Secondary | ICD-10-CM

## 2018-01-08 DIAGNOSIS — Z1211 Encounter for screening for malignant neoplasm of colon: Secondary | ICD-10-CM

## 2018-01-08 MED ORDER — ATENOLOL 50 MG PO TABS: 50.0000 mg | ORAL_TABLET | Freq: Every day | ORAL | 3 refills | Status: DC

## 2018-01-08 MED ORDER — ALLOPURINOL 100 MG PO TABS: ORAL_TABLET | ORAL | 3 refills | Status: DC

## 2018-01-08 MED ORDER — SILDENAFIL CITRATE 100 MG PO TABS: ORAL_TABLET | ORAL | 2 refills | Status: DC

## 2018-01-08 NOTE — Progress Notes (Signed)
Patient: Derek [name] Male    DOB: 08-Apr-1966   52 y.o.   MRN: [id number] Visit Date: 01/08/2018  Today's Provider: Vernie Murders, PA   Chief Complaint  Patient presents with  . Hypertension  . Follow-up   Subjective:    HPI  Hypertension, follow-up:  BP Readings from Last 3 Encounters:  01/08/18 (!) 142/98  01/09/17 (!) 138/96  03/14/16 (!) 148/92    He was last seen for hypertension 1 years ago.  BP at that visit was 138/96. Management changes since that visit include continue medications, and try CPAP to see if that lowers BP. Recheck BP in 3-4 months. He reports fair compliance with treatment. He is not having side effects.  He is not exercising. He is adherent to low salt diet.   Outside blood pressures are not being checked. He is experiencing none.  Patient denies chest pain, chest pressure/discomfort, claudication, dyspnea, exertional chest pressure/discomfort, fatigue, irregular heart beat, lower extremity edema, near-syncope, orthopnea, palpitations, paroxysmal nocturnal dyspnea, syncope and tachypnea.   Cardiovascular risk factors include hypertension, male gender and smoking/ tobacco exposure.  Use of agents associated with hypertension: none.    Wt Readings from Last 3 Encounters:  01/08/18 193 lb (87.5 kg)  01/09/17 193 lb (87.5 kg)  03/14/16 185 lb (83.9 kg)    Current diet: low salt  ------------------------------------------------------------------------  Past Medical History:  Diagnosis Date  . Gout   . Hypertension    Past Surgical History:  Procedure Laterality Date  . CERVICAL DISC SURGERY    . TONSILLECTOMY AND ADENOIDECTOMY     No Known Allergies  Current Outpatient Medications:  .  allopurinol (ZYLOPRIM) 100 MG tablet, TAKE 2 TABLETS (200 MG TOTAL) BY MOUTH ONCE DAILY., Disp: , Rfl: 12 .  atenolol (TENORMIN) 50 MG tablet, Take 1 tablet (50 mg total) by mouth daily., Disp: 90 tablet, Rfl: 3 .  lisinopril (PRINIVIL,ZESTRIL)  20 MG tablet, Take 1.5 tablets (30 mg total) by mouth at bedtime., Disp: 135 tablet, Rfl: 3 .  sildenafil (VIAGRA) 100 MG tablet, Take one tablet by mouth 1-4 hours prior to intercourse. Limit one tablet per day., Disp: 10 tablet, Rfl: 2 .  colchicine 0.6 MG tablet, Take 1 tablet (0.6 mg total) by mouth daily. Day 1, take 2 tablets, wait one hour and take 1 tablet. Then one tablet daily., Disp: 30 tablet, Rfl: 0  Review of Systems  Constitutional: Negative.   Respiratory: Negative.   Cardiovascular: Negative.    Social History   Tobacco Use  . Smoking status: Current Some Day Smoker  . Smokeless tobacco: Never Used  Substance Use Topics  . Alcohol use: Yes    Comment: occasionally   Objective:   BP (!) 142/98 (BP Location: Right Arm, Patient Position: Sitting, Cuff Size: Normal)   Pulse 82   Temp 97.9 F (36.6 C) (Oral)   Ht 5\' 9"  (1.753 m)   Wt 193 lb (87.5 kg)   SpO2 98%   BMI 28.50 kg/m   Physical Exam  Constitutional: He is oriented to person, place, and time. He appears well-developed and well-nourished.  HENT:  Head: Normocephalic and atraumatic.  Right Ear: External ear normal.  Left Ear: External ear normal.  Nose: Nose normal.  Mouth/Throat: Oropharynx is clear and moist.  Eyes: Conjunctivae and EOM are normal. Pupils are equal, round, and reactive to light. Right eye exhibits no discharge.  Neck: Normal range of motion. Neck supple. No tracheal deviation present.  No thyromegaly present.  Cardiovascular: Normal rate, regular rhythm, normal heart sounds and intact distal pulses.  No murmur heard. Pulmonary/Chest: Effort normal and breath sounds normal. No respiratory distress. He has no wheezes. He has no rales. He exhibits no tenderness.  Abdominal: Soft. He exhibits no distension and no mass. There is no tenderness. There is no rebound and no guarding.  Musculoskeletal: Normal range of motion. He exhibits no edema or tenderness.  Well healed scar anterior base of  the left side of the neck from past cervical surgery.  Lymphadenopathy:    He has no cervical adenopathy.  Neurological: He is alert and oriented to person, place, and time. He has normal reflexes. No cranial nerve deficit. He exhibits normal muscle tone. Coordination normal.  Skin: Skin is warm and dry. No rash noted. No erythema.  Psychiatric: He has a normal mood and affect. His behavior is normal. Judgment and thought content normal.      Assessment & Plan:     1. Essential (primary) hypertension BP elevated today. No chest pains, palpitations, edema or dyspnea. Needs refill of the Atenolol and still taking the Lisinopril. Encouraged to stop all tobacco use, limit caffeine, restrict salt intake and get back on medications regularly. Check routine labs and follow up pending reports. - CBC with Differential/Platelet; Future - Comprehensive metabolic panel; Future - Lipid panel; Future - TSH; Future - atenolol (TENORMIN) 50 MG tablet; Take 1 tablet (50 mg total) by mouth daily.  Dispense: 90 tablet; Refill: 3  2. Erectile dysfunction, unspecified erectile dysfunction type Difficulty maintaining erections. Requests refill of Viagra and will get follow up labs. No drop in libido. - CBC with Differential/Platelet; Future - PSA; Future - sildenafil (VIAGRA) 100 MG tablet; Take one tablet by mouth 1-4 hours prior to intercourse. Limit one tablet per day.  Dispense: 10 tablet; Refill: 2  3. Compulsive tobacco user syndrome Still smoking 1/2 ppd and using one can of [place] daily. Counseled on smoking and all tobacco cessation. States he is not ready yet.  4. Gout, unspecified cause, unspecified chronicity, unspecified site Well controlled with Allopurinol 200 mg qd. Will refill medication and recheck labs. - CBC with Differential/Platelet; Future - Comprehensive metabolic panel; Future - Uric acid; Future - allopurinol (ZYLOPRIM) 100 MG tablet; TAKE 2 TABLETS (200 MG TOTAL) BY MOUTH ONCE  DAILY.  Dispense: 180 tablet; Refill: 3  5. H/O arthrodesis Had cervical fusion C5-6 and C6-7 with plating and diskectomy on 10-04-13 by Dr. Pamala Hurry at [facility]. Well healed scar of left anterior base of neck without discomfort or neuropathy.  6. Encounter for screening for HIV - HIV antibody; Future  7. Colon cancer screening - Ambulatory referral to Gastroenterology  8. Need for Tdap vaccination - Tdap vaccine greater than or equal to 7yo IM       Vernie Murders, PA  [facility]

## 2018-01-08 NOTE — Patient Instructions (Signed)
Low-Purine Diet Purines are compounds that affect the level of uric acid in your body. A low-purine diet is a diet that is low in purines. Eating a low-purine diet can prevent the level of uric acid in your body from getting too high and causing gout or kidney stones or both. What do I need to know about this diet?  Choose low-purine foods. Examples of low-purine foods are listed in the next section.  Drink plenty of fluids, especially water. Fluids can help remove uric acid from your body. Try to drink 8-16 cups (1.9-3.8 L) a day.  Limit foods high in fat, especially saturated fat, as fat makes it harder for the body to get rid of uric acid. Foods high in saturated fat include pizza, cheese, ice cream, whole milk, fried foods, and gravies. Choose foods that are lower in fat and lean sources of protein. Use olive oil when cooking as it contains healthy fats that are not high in saturated fat.  Limit alcohol. Alcohol interferes with the elimination of uric acid from your body. If you are having a gout attack, avoid all alcohol.  Keep in mind that different people's bodies react differently to different foods. You will probably learn over time which foods do or do not affect you. If you discover that a food tends to cause your gout to flare up, avoid eating that food. You can more freely enjoy foods that do not cause problems. If you have any questions about a food item, talk to your dietitian or health care provider. Which foods are low, moderate, and high in purines? The following is a list of foods that are low, moderate, and high in purines. You can eat any amount of the foods that are low in purines. You may be able to have small amounts of foods that are moderate in purines. Ask your health care provider how much of a food moderate in purines you can have. Avoid foods high in purines. Grains  Foods low in purines: Enriched white bread, pasta, rice, cake, cornbread, popcorn.  Foods moderate in  purines: Whole-grain breads and cereals, wheat germ, bran, oatmeal. Uncooked oatmeal. Dry wheat bran or wheat germ.  Foods high in purines: Pancakes, French toast, biscuits, muffins. Vegetables  Foods low in purines: All vegetables, except those that are moderate in purines.  Foods moderate in purines: Asparagus, cauliflower, spinach, mushrooms, green peas. Fruits  All fruits are low in purines. Meats and other Protein Foods  Foods low in purines: Eggs, nuts, peanut butter.  Foods moderate in purines: 80-90% lean beef, lamb, veal, pork, poultry, fish, eggs, peanut butter, nuts. Crab, lobster, oysters, and shrimp. Cooked dried beans, peas, and lentils.  Foods high in purines: Anchovies, sardines, herring, mussels, tuna, codfish, scallops, trout, and haddock. Bacon. Organ meats (such as liver or kidney). Tripe. Game meat. Goose. Sweetbreads. Dairy  All dairy foods are low in purines. Low-fat and fat-free dairy products are best because they are low in saturated fat. Beverages  Drinks low in purines: Water, carbonated beverages, tea, coffee, cocoa.  Drinks moderate in purines: Soft drinks and other drinks sweetened with high-fructose corn syrup. Juices. To find whether a food or drink is sweetened with high-fructose corn syrup, look at the ingredients list.  Drinks high in purines: Alcoholic beverages (such as beer). Condiments  Foods low in purines: Salt, herbs, olives, pickles, relishes, vinegar.  Foods moderate in purines: Butter, margarine, oils, mayonnaise. Fats and Oils  Foods low in purines: All types, except gravies   and sauces made with meat.  Foods high in purines: Gravies and sauces made with meat. Other Foods  Foods low in purines: Sugars, sweets, gelatin. Cake. Soups made without meat.  Foods moderate in purines: Meat-based or fish-based soups, broths, or bouillons. Foods and drinks sweetened with high-fructose corn syrup.  Foods high in purines: High-fat desserts  (such as ice cream, cookies, cakes, pies, doughnuts, and chocolate). Contact your dietitian for more information on foods that are not listed here. This information is not intended to replace advice given to you by your health care provider. Make sure you discuss any questions you have with your health care provider. Document Released: 02/03/2011 Document Revised: 03/16/2016 Document Reviewed: 09/15/2013 Elsevier Interactive Patient Education  2017 [place] DASH stands for "Dietary Approaches to Stop Hypertension." The DASH eating plan is a healthy eating plan that has been shown to reduce high blood pressure (hypertension). It may also reduce your risk for type 2 diabetes, heart disease, and stroke. The DASH eating plan may also help with weight loss. What are tips for following this plan? General guidelines  Avoid eating more than 2,300 mg (milligrams) of salt (sodium) a day. If you have hypertension, you may need to reduce your sodium intake to 1,500 mg a day.  Limit alcohol intake to no more than 1 drink a day for nonpregnant women and 2 drinks a day for men. One drink equals 12 oz of beer, 5 oz of wine, or 1 oz of hard liquor.  Work with your health care provider to maintain a healthy body weight or to lose weight. Ask what an ideal weight is for you.  Get at least 30 minutes of exercise that causes your heart to beat faster (aerobic exercise) most days of the week. Activities may include walking, swimming, or biking.  Work with your health care provider or diet and nutrition specialist (dietitian) to adjust your eating plan to your individual calorie needs. Reading food labels  Check food labels for the amount of sodium per serving. Choose foods with less than 5 percent of the Daily Value of sodium. Generally, foods with less than 300 mg of sodium per serving fit into this eating plan.  To find whole grains, look for the word "whole" as the first word in the  ingredient list. Shopping  Buy products labeled as "low-sodium" or "no salt added."  Buy fresh foods. Avoid canned foods and premade or frozen meals. Cooking  Avoid adding salt when cooking. Use salt-free seasonings or herbs instead of table salt or sea salt. Check with your health care provider or pharmacist before using salt substitutes.  Do not fry foods. Cook foods using healthy methods such as baking, boiling, grilling, and broiling instead.  Cook with heart-healthy oils, such as olive, canola, soybean, or sunflower oil. Meal planning   Eat a balanced diet that includes: ? 5 or more servings of fruits and vegetables each day. At each meal, try to fill half of your plate with fruits and vegetables. ? Up to 6-8 servings of whole grains each day. ? Less than 6 oz of lean meat, poultry, or fish each day. A 3-oz serving of meat is about the same size as a deck of cards. One egg equals 1 oz. ? 2 servings of low-fat dairy each day. ? A serving of nuts, seeds, or beans 5 times each week. ? Heart-healthy fats. Healthy fats called Omega-3 fatty acids are found in foods such as flaxseeds and coldwater  fish, like sardines, salmon, and mackerel.  Limit how much you eat of the following: ? Canned or prepackaged foods. ? Food that is high in trans fat, such as fried foods. ? Food that is high in saturated fat, such as fatty meat. ? Sweets, desserts, sugary drinks, and other foods with added sugar. ? Full-fat dairy products.  Do not salt foods before eating.  Try to eat at least 2 vegetarian meals each week.  Eat more home-cooked food and less restaurant, buffet, and fast food.  When eating at a restaurant, ask that your food be prepared with less salt or no salt, if possible. What foods are recommended? The items listed may not be a complete list. Talk with your dietitian about what dietary choices are best for you. Grains Whole-grain or whole-wheat bread. Whole-grain or whole-wheat  pasta. Brown rice. [place]. Bulgur. Whole-grain and low-sodium cereals. Pita bread. Low-fat, low-sodium crackers. Whole-wheat flour tortillas. Vegetables Fresh or frozen vegetables (raw, steamed, roasted, or grilled). Low-sodium or reduced-sodium tomato and vegetable juice. Low-sodium or reduced-sodium tomato sauce and tomato paste. Low-sodium or reduced-sodium canned vegetables. Fruits All fresh, dried, or frozen fruit. Canned fruit in natural juice (without added sugar). Meat and other protein foods Skinless chicken or [place]. Ground chicken or [place]. Pork with fat trimmed off. Fish and seafood. Egg whites. Dried beans, peas, or lentils. Unsalted nuts, nut butters, and seeds. Unsalted canned beans. Lean cuts of beef with fat trimmed off. Low-sodium, lean deli meat. Dairy Low-fat (1%) or fat-free (skim) milk. Fat-free, low-fat, or reduced-fat cheeses. Nonfat, low-sodium ricotta or cottage cheese. Low-fat or nonfat yogurt. Low-fat, low-sodium cheese. Fats and oils Soft margarine without trans fats. Vegetable oil. Low-fat, reduced-fat, or light mayonnaise and salad dressings (reduced-sodium). Canola, safflower, olive, soybean, and sunflower oils. Avocado. Seasoning and other foods Herbs. Spices. Seasoning mixes without salt. Unsalted popcorn and pretzels. Fat-free sweets. What foods are not recommended? The items listed may not be a complete list. Talk with your dietitian about what dietary choices are best for you. Grains Baked goods made with fat, such as croissants, muffins, or some breads. Dry pasta or rice meal packs. Vegetables Creamed or fried vegetables. Vegetables in a cheese sauce. Regular canned vegetables (not low-sodium or reduced-sodium). Regular canned tomato sauce and paste (not low-sodium or reduced-sodium). Regular tomato and vegetable juice (not low-sodium or reduced-sodium). Angie Fava. Olives. Fruits Canned fruit in a light or heavy syrup. Fried fruit. Fruit in cream or  butter sauce. Meat and other protein foods Fatty cuts of meat. Ribs. Fried meat. Berniece Salines. Sausage. Bologna and other processed lunch meats. Salami. Fatback. Hotdogs. Bratwurst. Salted nuts and seeds. Canned beans with added salt. Canned or smoked fish. Whole eggs or egg yolks. Chicken or [place] with skin. Dairy Whole or 2% milk, cream, and half-and-half. Whole or full-fat cream cheese. Whole-fat or sweetened yogurt. Full-fat cheese. Nondairy creamers. Whipped toppings. Processed cheese and cheese spreads. Fats and oils Butter. Stick margarine. Lard. Shortening. Ghee. Bacon fat. Tropical oils, such as coconut, palm kernel, or palm oil. Seasoning and other foods Salted popcorn and pretzels. Onion salt, garlic salt, seasoned salt, table salt, and sea salt. Worcestershire sauce. Tartar sauce. Barbecue sauce. Teriyaki sauce. Soy sauce, including reduced-sodium. Steak sauce. Canned and packaged gravies. Fish sauce. Oyster sauce. Cocktail sauce. Horseradish that you find on the shelf. Ketchup. Mustard. Meat flavorings and tenderizers. Bouillon cubes. Hot sauce and Tabasco sauce. Premade or packaged marinades. Premade or packaged taco seasonings. Relishes. Regular salad dressings. Where to find more  information:  National Heart, Lung, and Blood Institute: https://wilson-eaton.com/  American Heart Association: www.heart.org Summary  The DASH eating plan is a healthy eating plan that has been shown to reduce high blood pressure (hypertension). It may also reduce your risk for type 2 diabetes, heart disease, and stroke.  With the DASH eating plan, you should limit salt (sodium) intake to 2,300 mg a day. If you have hypertension, you may need to reduce your sodium intake to 1,500 mg a day.  When on the DASH eating plan, aim to eat more fresh fruits and vegetables, whole grains, lean proteins, low-fat dairy, and heart-healthy fats.  Work with your health care provider or diet and nutrition specialist (dietitian) to  adjust your eating plan to your individual calorie needs. This information is not intended to replace advice given to you by your health care provider. Make sure you discuss any questions you have with your health care provider. Document Released: 09/28/2011 Document Revised: 10/02/2016 Document Reviewed: 10/02/2016 Elsevier Interactive Patient Education  [employer].

## 2018-01-14 ENCOUNTER — Telehealth: Payer: Self-pay | Admitting: Gastroenterology

## 2018-01-14 NOTE — Telephone Encounter (Signed)
Gastroenterology Pre-Procedure Review  Request Date:   Requesting Physician: Dr.    PATIENT REVIEW QUESTIONS: The patient responded to the following health history questions as indicated:    1. Are you having any GI issues? no 2. Do you have a personal history of Polyps? no 3. Do you have a family history of Colon Cancer or Polyps? no 4. Diabetes Mellitus? no 5. Joint replacements in the past 12 months?no 6. Major health problems in the past 3 months?no 7. Any artificial heart valves, MVP, or defibrillator?no    MEDICATIONS & ALLERGIES:    Patient reports the following regarding taking any anticoagulation/antiplatelet therapy:   Plavix, Coumadin, Eliquis, Xarelto, Lovenox, Pradaxa, Brilinta, or Effient? no Aspirin? no  Patient confirms/reports the following medications:  Current Outpatient Medications  Medication Sig Dispense Refill   allopurinol (ZYLOPRIM) 100 MG tablet TAKE 2 TABLETS (200 MG TOTAL) BY MOUTH ONCE DAILY. 180 tablet 3   atenolol (TENORMIN) 50 MG tablet Take 1 tablet (50 mg total) by mouth daily. 90 tablet 3   colchicine 0.6 MG tablet Take 1 tablet (0.6 mg total) by mouth daily. Day 1, take 2 tablets, wait one hour and take 1 tablet. Then one tablet daily. 30 tablet 0   lisinopril (PRINIVIL,ZESTRIL) 20 MG tablet Take 1.5 tablets (30 mg total) by mouth at bedtime. 135 tablet 3   sildenafil (VIAGRA) 100 MG tablet Take one tablet by mouth 1-4 hours prior to intercourse. Limit one tablet per day. 10 tablet 2   No current facility-administered medications for this visit.     Patient confirms/reports the following allergies:  No Known Allergies  No orders of the defined types were placed in this encounter.   AUTHORIZATION INFORMATION Primary Insurance: 1D#: Group #:  Secondary Insurance: 1D#: Group #:  SCHEDULE INFORMATION: Date:  Time: Location:

## 2018-01-18 ENCOUNTER — Telehealth: Payer: Self-pay

## 2018-01-18 NOTE — Telephone Encounter (Signed)
LVM informing patient his colonoscopy has been scheduled for him on 02/18/18 at [facility], detailed instructions will be mailed to him and if this date does not work for him to contact office to reschedule.  Thanks [employer]

## 2018-01-19 ENCOUNTER — Other Ambulatory Visit: Payer: Self-pay

## 2018-01-19 DIAGNOSIS — Z1211 Encounter for screening for malignant neoplasm of colon: Secondary | ICD-10-CM

## 2018-02-18 ENCOUNTER — Encounter: Payer: Self-pay | Admitting: *Deleted

## 2018-02-18 ENCOUNTER — Ambulatory Visit
Admission: RE | Admit: 2018-02-18 | Discharge: 2018-02-18 | Disposition: A | Discharge status: Home / Self Care | Payer: BLUE CROSS/BLUE SHIELD | Source: Ambulatory Visit | Attending: Gastroenterology | Admitting: Gastroenterology

## 2018-02-18 ENCOUNTER — Encounter
Admission: RE | Disposition: A | Discharge status: Home / Self Care | Payer: Self-pay | Source: Ambulatory Visit | Attending: Gastroenterology

## 2018-02-18 ENCOUNTER — Ambulatory Visit: Payer: BLUE CROSS/BLUE SHIELD | Admitting: Certified Registered Nurse Anesthetist

## 2018-02-18 DIAGNOSIS — Z8042 Family history of malignant neoplasm of prostate: Secondary | ICD-10-CM | POA: Diagnosis not present

## 2018-02-18 DIAGNOSIS — I10 Essential (primary) hypertension: Secondary | ICD-10-CM | POA: Diagnosis not present

## 2018-02-18 DIAGNOSIS — M109 Gout, unspecified: Secondary | ICD-10-CM | POA: Insufficient documentation

## 2018-02-18 DIAGNOSIS — Z1211 Encounter for screening for malignant neoplasm of colon: Secondary | ICD-10-CM | POA: Diagnosis not present

## 2018-02-18 DIAGNOSIS — Z79899 Other long term (current) drug therapy: Secondary | ICD-10-CM | POA: Insufficient documentation

## 2018-02-18 DIAGNOSIS — Z8249 Family history of ischemic heart disease and other diseases of the circulatory system: Secondary | ICD-10-CM | POA: Diagnosis not present

## 2018-02-18 HISTORY — PX: COLONOSCOPY WITH PROPOFOL: SHX5780

## 2018-02-18 SURGERY — COLONOSCOPY WITH PROPOFOL
Anesthesia: General

## 2018-02-18 MED ORDER — PROPOFOL 10 MG/ML IV BOLUS
INTRAVENOUS | Status: DC | PRN
  Administered 2018-02-18: 90 mg via INTRAVENOUS

## 2018-02-18 MED ORDER — PROPOFOL 500 MG/50ML IV EMUL
INTRAVENOUS | Status: DC | PRN
  Administered 2018-02-18: 100 ug/kg/min via INTRAVENOUS

## 2018-02-18 MED ORDER — LIDOCAINE HCL (PF) 1 % IJ SOLN
INTRAMUSCULAR | Status: AC
  Administered 2018-02-18: 0.3 mL via INTRADERMAL
  Filled 2018-02-18: qty 2

## 2018-02-18 MED ORDER — LIDOCAINE HCL (PF) 1 % IJ SOLN
2.0000 mL | Freq: Once | INTRAMUSCULAR | Status: AC
  Administered 2018-02-18: 0.3 mL via INTRADERMAL

## 2018-02-18 MED ORDER — LIDOCAINE HCL (CARDIAC) PF 100 MG/5ML IV SOSY
PREFILLED_SYRINGE | INTRAVENOUS | Status: DC | PRN
  Administered 2018-02-18: 100 mg via INTRAVENOUS

## 2018-02-18 MED ORDER — PROPOFOL 500 MG/50ML IV EMUL
INTRAVENOUS | Status: AC
  Filled 2018-02-18: qty 50

## 2018-02-18 MED ORDER — SODIUM CHLORIDE 0.9 % IV SOLN
INTRAVENOUS | Status: DC
  Administered 2018-02-18: 1000 mL via INTRAVENOUS

## 2018-02-18 MED ORDER — PROPOFOL 10 MG/ML IV BOLUS
INTRAVENOUS | Status: AC
  Filled 2018-02-18: qty 80

## 2018-02-18 NOTE — H&P (Signed)
Cephas Darby, MD [address]  [place]  [place], [place] [address]  Main: [phone]  Fax: [phone] Pager: [phone]  Primary Care Physician:  Margo Common, PA Primary Gastroenterologist:  Dr. Cephas Darby  Pre-Procedure History & Physical: HPI:  Derek [name] is a 52 y.o. male is here for an colonoscopy.   Past Medical History:  Diagnosis Date  . Gout   . Hypertension     Past Surgical History:  Procedure Laterality Date  . CERVICAL DISC SURGERY    . TONSILLECTOMY AND ADENOIDECTOMY      Prior to Admission medications   Medication Sig Start Date End Date Taking? Authorizing Provider  allopurinol (ZYLOPRIM) 100 MG tablet TAKE 2 TABLETS (200 MG TOTAL) BY MOUTH ONCE DAILY. 01/08/18   Chrismon, Vickki Muff, PA  atenolol (TENORMIN) 50 MG tablet Take 1 tablet (50 mg total) by mouth daily. 01/08/18   Chrismon, Vickki Muff, PA  colchicine 0.6 MG tablet Take 1 tablet (0.6 mg total) by mouth daily. Day 1, take 2 tablets, wait one hour and take 1 tablet. Then one tablet daily. 12/19/15 12/18/16  Sable Feil, PA-C  lisinopril (PRINIVIL,ZESTRIL) 20 MG tablet Take 1.5 tablets (30 mg total) by mouth at bedtime. 01/03/18   Chrismon, Vickki Muff, PA  sildenafil (VIAGRA) 100 MG tablet Take one tablet by mouth 1-4 hours prior to intercourse. Limit one tablet per day. 01/08/18   Chrismon, Vickki Muff, PA    Allergies as of 01/20/2018  . (No Known Allergies)    Family History  Problem Relation Age of Onset  . Fibromyalgia Mother   . Prostate cancer Father   . Hypertension Father   . Healthy Sister     Social History   Socioeconomic History  . Marital status: Divorced    Spouse name: Not on file  . Number of children: Not on file  . Years of education: Not on file  . Highest education level: Not on file  Occupational History  . Not on file  Social Needs  . Financial resource strain: Not on file  . Food insecurity:    Worry: Not on file    Inability: Not on file  .  Transportation needs:    Medical: Not on file    Non-medical: Not on file  Tobacco Use  . Smoking status: Current Some Day Smoker  . Smokeless tobacco: Never Used  Substance and Sexual Activity  . Alcohol use: Yes    Comment: occasionally  . Drug use: No  . Sexual activity: Not on file  Lifestyle  . Physical activity:    Days per week: Not on file    Minutes per session: Not on file  . Stress: Not on file  Relationships  . Social connections:    Talks on phone: Not on file    Gets together: Not on file    Attends religious service: Not on file    Active member of club or organization: Not on file    Attends meetings of clubs or organizations: Not on file    Relationship status: Not on file  . Intimate partner violence:    Fear of current or ex partner: Not on file    Emotionally abused: Not on file    Physically abused: Not on file    Forced sexual activity: Not on file  Other Topics Concern  . Not on file  Social History Narrative  . Not on file    Review of Systems:  See HPI, otherwise negative ROS  Physical Exam: BP (!) 134/98   Pulse 80   Temp (!) 97 F (36.1 C) (Tympanic)   Resp 17   Ht 5\' 9"  (1.753 m)   Wt 179 lb (81.2 kg)   SpO2 100%   BMI 26.43 kg/m  General:   Alert,  pleasant and cooperative in NAD Head:  Normocephalic and atraumatic. Neck:  Supple; no masses or thyromegaly. Lungs:  Clear throughout to auscultation.    Heart:  Regular rate and rhythm. Abdomen:  Soft, nontender and nondistended. Normal bowel sounds, without guarding, and without rebound.   Neurologic:  Alert and  oriented x4;  grossly normal neurologically.  Impression/Plan: Derek [name] is here for an colonoscopy to be performed for colon cancer screening  Risks, benefits, limitations, and alternatives regarding  colonoscopy have been reviewed with the patient.  Questions have been answered.  All parties agreeable.   Sherri Sear, MD  02/18/2018, 8:07 AM

## 2018-02-18 NOTE — Anesthesia Post-op Follow-up Note (Signed)
Anesthesia QCDR form completed.        

## 2018-02-18 NOTE — Anesthesia Postprocedure Evaluation (Signed)
Anesthesia Post Note  Patient: Derek [name]  Procedure(s) Performed: COLONOSCOPY WITH PROPOFOL (N/A )  Patient location during evaluation: Endoscopy Anesthesia Type: General Level of consciousness: awake and alert and oriented Pain management: pain level controlled Vital Signs Assessment: post-procedure vital signs reviewed and stable Respiratory status: spontaneous breathing, nonlabored ventilation and respiratory function stable Cardiovascular status: blood pressure returned to baseline and stable Postop Assessment: no signs of nausea or vomiting Anesthetic complications: no     Last Vitals:  Vitals:   02/18/18 0826 02/18/18 0906  BP: 105/84 130/70  Pulse:    Resp: 16 16  Temp: (!) 36.1 C   SpO2: 98% 99%    Last Pain:  Vitals:   02/18/18 0906  TempSrc:   PainSc: 0-No pain                 Kelyse Pask

## 2018-02-18 NOTE — Transfer of Care (Signed)
Immediate Anesthesia Transfer of Care Note  Patient: Derek [name]  Procedure(s) Performed: COLONOSCOPY WITH PROPOFOL (N/A )  Patient Location: PACU and Endoscopy Unit  Anesthesia Type:General  Level of Consciousness: awake, oriented and patient cooperative  Airway & Oxygen Therapy: Patient Spontanous Breathing  Post-op Assessment: Report given to RN, Post -op Vital signs reviewed and stable and Patient moving all extremities  Post vital signs: Reviewed and stable  Last Vitals:  Vitals Value Taken Time  BP 105/84 02/18/2018  8:28 AM  Temp 36.1 C 02/18/2018  8:26 AM  Pulse 68 02/18/2018  8:28 AM  Resp 14 02/18/2018  8:28 AM  SpO2 99 % 02/18/2018  8:28 AM  Vitals shown include unvalidated device data.  Last Pain:  Vitals:   02/18/18 0826  TempSrc: Tympanic  PainSc: 0-No pain         Complications: No apparent anesthesia complications

## 2018-02-18 NOTE — Op Note (Signed)
[facility] Gastroenterology Patient Name: Derek [name] Procedure Date: 02/18/2018 8:08 AM MRN: [id number] Account #: [id number] Date of Birth: 1966-07-28 Admit Type: Outpatient Age: 52 Room: [facility] ENDO ROOM 2 Gender: Male Note Status: Finalized Procedure:            Colonoscopy Indications:          Screening for colorectal malignant neoplasm, This is                        the patient's first colonoscopy Providers:            Lin Landsman MD, MD Referring MD:         Vickki Muff. Chrismon, MD (Referring MD) Medicines:            Monitored Anesthesia Care Complications:        No immediate complications. Estimated blood loss: None. Procedure:            Pre-Anesthesia Assessment:                       - Prior to the procedure, a History and Physical was                        performed, and patient medications and allergies were                        reviewed. The patient is competent. The risks and                        benefits of the procedure and the sedation options and                        risks were discussed with the patient. All questions                        were answered and informed consent was obtained.                        Patient identification and proposed procedure were                        verified by the physician, the nurse, the                        anesthesiologist, the anesthetist and the technician in                        the pre-procedure area in the procedure room in the                        endoscopy suite. Mental Status Examination: alert and                        oriented. Airway Examination: normal oropharyngeal                        airway and neck mobility. Respiratory Examination:                        clear to auscultation. CV Examination: normal.  Prophylactic Antibiotics: The patient does not require                        prophylactic antibiotics. Prior Anticoagulants: The                         patient has taken no previous anticoagulant or                        antiplatelet agents. ASA Grade Assessment: II - A                        patient with mild systemic disease. After reviewing the                        risks and benefits, the patient was deemed in                        satisfactory condition to undergo the procedure. The                        anesthesia plan was to use monitored anesthesia care                        (MAC). Immediately prior to administration of                        medications, the patient was re-assessed for adequacy                        to receive sedatives. The heart rate, respiratory rate,                        oxygen saturations, blood pressure, adequacy of                        pulmonary ventilation, and response to care were                        monitored throughout the procedure. The physical status                        of the patient was re-assessed after the procedure.                       After obtaining informed consent, the colonoscope was                        passed under direct vision. Throughout the procedure,                        the patient's blood pressure, pulse, and oxygen                        saturations were monitored continuously. The                        Colonoscope was introduced through the anus and  advanced to the the cecum, identified by appendiceal                        orifice and ileocecal valve. The colonoscopy was                        performed without difficulty. The patient tolerated the                        procedure well. The quality of the bowel preparation                        was evaluated using the BBPS [facility] Bowel Preparation                        Scale) with scores of: Right Colon = 3, Transverse                        Colon = 3 and Left Colon = 3 (entire mucosa seen well                        with no residual staining, small fragments of stool or                         opaque liquid). The total BBPS score equals 9. Findings:      The perianal and digital rectal examinations were normal. Pertinent       negatives include normal sphincter tone and no palpable rectal lesions.      The colon (entire examined portion) appeared normal.      The retroflexed view of the distal rectum and anal verge was normal and       showed no anal or rectal abnormalities. Impression:           - The entire examined colon is normal.                       - The distal rectum and anal verge are normal on                        retroflexion view.                       - No specimens collected. Recommendation:       - Discharge patient to home (with escort).                       - Resume previous diet today.                       - Continue present medications.                       - Repeat colonoscopy in 10 years for surveillance. Procedure Code(s):    --- Professional ---                       Z6109, Colorectal cancer screening; colonoscopy on                        individual not meeting criteria for high risk Diagnosis Code(s):    ---  Professional ---                       Z12.11, Encounter for screening for malignant neoplasm                        of colon CPT copyright 2017 American Medical Association. All rights reserved. The codes documented in this report are preliminary and upon coder review may  be revised to meet current compliance requirements. Dr. Ulyess Mort Lin Landsman MD, MD 02/18/2018 8:25:05 AM This report has been signed electronically. Number of Addenda: 0 Note Initiated On: 02/18/2018 8:08 AM Scope Withdrawal Time: 0 hours 6 minutes 51 seconds  Total Procedure Duration: 0 hours 8 minutes 29 seconds       [facility]

## 2018-02-18 NOTE — Anesthesia Preprocedure Evaluation (Signed)
Anesthesia Evaluation  Patient identified by MRN, date of birth, ID band Patient awake    Reviewed: Allergy & Precautions, NPO status , Patient's Chart, lab work & pertinent test results  History of Anesthesia Complications Negative for: history of anesthetic complications  Airway Mallampati: II  TM Distance: >3 FB Neck ROM: Full    Dental  (+) Poor Dentition, Chipped   Pulmonary neg sleep apnea, neg COPD, Current Smoker,    breath sounds clear to auscultation- rhonchi (-) wheezing      Cardiovascular hypertension, Pt. on medications (-) CAD, (-) Past MI, (-) Cardiac Stents and (-) CABG  Rhythm:Regular Rate:Normal - Systolic murmurs and - Diastolic murmurs    Neuro/Psych  Headaches, negative psych ROS   GI/Hepatic negative GI ROS, Neg liver ROS,   Endo/Other  negative endocrine ROSneg diabetes  Renal/GU Renal disease: hx of nephrolithiasis.     Musculoskeletal  (+) Arthritis ,   Abdominal (+) - obese,   Peds  Hematology negative hematology ROS (+)   Anesthesia Other Findings Past Medical History: No date: Gout No date: Hypertension   Reproductive/Obstetrics                             Anesthesia Physical Anesthesia Plan  ASA: II  Anesthesia Plan: General   Post-op Pain Management:    Induction: Intravenous  PONV Risk Score and Plan: 0 and Propofol infusion  Airway Management Planned: Natural Airway  Additional Equipment:   Intra-op Plan:   Post-operative Plan:   Informed Consent: I have reviewed the patients History and Physical, chart, labs and discussed the procedure including the risks, benefits and alternatives for the proposed anesthesia with the patient or authorized representative who has indicated his/her understanding and acceptance.   Dental advisory given  Plan Discussed with: CRNA and Anesthesiologist  Anesthesia Plan Comments:         Anesthesia  Quick Evaluation

## 2018-02-19 ENCOUNTER — Encounter: Payer: Self-pay | Admitting: Gastroenterology

## 2018-04-05 ENCOUNTER — Encounter: Payer: Self-pay | Admitting: Family Medicine

## 2018-04-05 ENCOUNTER — Ambulatory Visit: Payer: BLUE CROSS/BLUE SHIELD | Admitting: Family Medicine

## 2018-04-05 VITALS — BP 128/84 | HR 94 | Temp 98.4°F | Resp 18 | Wt 176.0 lb

## 2018-04-05 DIAGNOSIS — M65312 Trigger thumb, left thumb: Secondary | ICD-10-CM

## 2018-04-05 DIAGNOSIS — J069 Acute upper respiratory infection, unspecified: Secondary | ICD-10-CM

## 2018-04-05 MED ORDER — HYDROCODONE-HOMATROPINE 5-1.5 MG/5ML PO SYRP: 5.0000 mL | ORAL_SOLUTION | Freq: Four times a day (QID) | ORAL | 0 refills | Status: AC | PRN

## 2018-04-05 MED ORDER — HYDROCODONE-ACETAMINOPHEN 5-325 MG PO TABS: 1.0000 | ORAL_TABLET | Freq: Four times a day (QID) | ORAL | 0 refills | Status: DC | PRN

## 2018-04-05 NOTE — Patient Instructions (Addendum)
Continue use of Mucinex D for congestion. Minimize smoking. We will call about the orthopedic referral. Call is sinuses not improving early next week.

## 2018-04-05 NOTE — Progress Notes (Signed)
  Subjective:     Patient ID: Derek [name], male   DOB: Feb 06, 1966, 52 y.o.   MRN: [id number] Chief Complaint  Patient presents with  . URI    Pt reports that he started not feeling well 3 days ago. He has a cough, sinus congestion, headaches, sinus pain and pressure, fever of 100 in the evenings. He reports that he has been taking mucinex for his symptoms. Denies productive cough or mucus with color.    HPI States he has minimized smoking while ill. Reports low grade fever and  headache persist. No tick exposure. Wishes further disposition of left trigger thumb.  Review of Systems     Objective:   Physical Exam  Constitutional: He appears well-developed and well-nourished. No distress.  Ears: T.M's intact without inflammation Sinuses: non-tender Throat: tonsils absent Neck: no cervical adenopathy Lungs: clear     Assessment:    1. Viral upper respiratory tract infection - HYDROcodone-homatropine (HYCODAN) 5-1.5 MG/5ML syrup; Take 5 mLs by mouth every 6 (six) hours as needed for up to 5 days. 5 ml 4-6 hours as needed for cough  Dispense: 100 mL; Refill: 0  2. Trigger finger of left thumb - Ambulatory referral to Orthopedic Surgery    Plan:    Continue Mucinex D. We will call you about the orthopedic referral. Minimize smoking. Phone f/u early next week if not improving.

## 2018-04-23 DIAGNOSIS — M65312 Trigger thumb, left thumb: Secondary | ICD-10-CM | POA: Diagnosis not present

## 2018-05-21 ENCOUNTER — Ambulatory Visit: Payer: BLUE CROSS/BLUE SHIELD | Admitting: Family Medicine

## 2018-05-21 ENCOUNTER — Encounter: Payer: Self-pay | Admitting: Family Medicine

## 2018-05-21 VITALS — BP 128/92 | HR 75 | Temp 98.3°F | Resp 16 | Wt 181.4 lb

## 2018-05-21 DIAGNOSIS — G43009 Migraine without aura, not intractable, without status migrainosus: Secondary | ICD-10-CM | POA: Diagnosis not present

## 2018-05-21 MED ORDER — NORTRIPTYLINE HCL 25 MG PO CAPS: ORAL_CAPSULE | ORAL | 0 refills | Status: DC

## 2018-05-21 MED ORDER — KETOROLAC TROMETHAMINE 60 MG/2ML IM SOLN
60.0000 mg | Freq: Once | INTRAMUSCULAR | Status: AC
  Administered 2018-05-21: 60 mg via INTRAMUSCULAR

## 2018-05-21 NOTE — Progress Notes (Signed)
  Subjective:     Patient ID: Derek [name], male   DOB: [birth year], 52 y.o.   MRN: [id number] Chief Complaint  Patient presents with  . Headache    Patient comes in office today with complaint of headache for over a 2 week period, patient states that he has headaches every day and describes it as a pounding sensation. Patient states that pain has been behind right eye radiating across head, he has had blurred vision and nausea associated. Patient has been taking otc Tylenol 650mg  for relief.    HPI States he will typically get migraine headaches as many as 2-3 x week. He will get relief with 650 mg. Tylenol. States he had a particularly bad migraine on 7/26 which is just abating now. Has been taking Tylenol 650 mg. Every 8 hours since Friday.  Review of Systems     Objective:   Physical Exam  Constitutional: He appears well-developed and well-nourished. No distress.       Assessment:    1. Migraine without aura and without status migrainosus, not intractable: ? Rebound headaches - nortriptyline (PAMELOR) 25 MG capsule; One or two at bedtime  Dispense: 60 capsule; Refill: 0 - ketorolac (TORADOL) injection 60 mg    Plan:    Further f/u with primary provider in 4 weeks or may call for headache recurrence.

## 2018-06-17 ENCOUNTER — Other Ambulatory Visit: Payer: Self-pay | Admitting: Family Medicine

## 2018-06-17 DIAGNOSIS — G43009 Migraine without aura, not intractable, without status migrainosus: Secondary | ICD-10-CM

## 2018-06-20 ENCOUNTER — Ambulatory Visit: Payer: Self-pay | Admitting: Family Medicine

## 2018-06-24 ENCOUNTER — Other Ambulatory Visit: Payer: Self-pay | Admitting: Family Medicine

## 2018-06-24 DIAGNOSIS — G43009 Migraine without aura, not intractable, without status migrainosus: Secondary | ICD-10-CM

## 2018-06-25 ENCOUNTER — Other Ambulatory Visit: Payer: Self-pay | Admitting: Family Medicine

## 2018-06-25 DIAGNOSIS — G43009 Migraine without aura, not intractable, without status migrainosus: Secondary | ICD-10-CM

## 2018-06-25 MED ORDER — NORTRIPTYLINE HCL 25 MG PO CAPS: ORAL_CAPSULE | ORAL | 1 refills | Status: DC

## 2018-07-16 ENCOUNTER — Ambulatory Visit: Payer: Self-pay | Admitting: Family Medicine

## 2018-11-13 ENCOUNTER — Ambulatory Visit: Payer: Self-pay | Admitting: Medical

## 2018-11-13 ENCOUNTER — Encounter: Payer: Self-pay | Admitting: Medical

## 2018-11-13 VITALS — BP 152/89 | HR 84 | Temp 98.2°F | Resp 16 | Ht 69.0 in | Wt 187.0 lb

## 2018-11-13 DIAGNOSIS — F17200 Nicotine dependence, unspecified, uncomplicated: Secondary | ICD-10-CM

## 2018-11-13 DIAGNOSIS — R05 Cough: Secondary | ICD-10-CM

## 2018-11-13 DIAGNOSIS — R059 Cough, unspecified: Secondary | ICD-10-CM

## 2018-11-13 DIAGNOSIS — R030 Elevated blood-pressure reading, without diagnosis of hypertension: Secondary | ICD-10-CM

## 2018-11-13 DIAGNOSIS — J069 Acute upper respiratory infection, unspecified: Secondary | ICD-10-CM

## 2018-11-13 MED ORDER — ALBUTEROL SULFATE HFA 108 (90 BASE) MCG/ACT IN AERS: 2.0000 | INHALATION_SPRAY | Freq: Four times a day (QID) | RESPIRATORY_TRACT | 0 refills | Status: DC | PRN

## 2018-11-13 MED ORDER — AMOXICILLIN-POT CLAVULANATE 875-125 MG PO TABS: 1.0000 | ORAL_TABLET | Freq: Two times a day (BID) | ORAL | 0 refills | Status: DC

## 2018-11-13 MED ORDER — BENZONATATE 100 MG PO CAPS: ORAL_CAPSULE | ORAL | 0 refills | Status: DC

## 2018-11-13 NOTE — Progress Notes (Signed)
Subjective:    Patient ID: Derek [name], male    DOB: Feb 21, 1966, 53 y.o.   MRN: [id number]  HPI 53 yo male in non acute distress. Complains of cough and congestion x one week symptoms  Shortness of breath,cough, sneezing , sore throat, ear pain I left. Smoker 1/2 ppd. Took AlkaSeltzer tablets one at 5am and 10 am.  No history of Bronchitis or  Pneumonia. . Blood pressure (!) 152/89, pulse 84, temperature 98.2 F (36.8 C), resp. rate 16, height 5\' 9"  (1.753 m), weight 187 lb (84.8 kg), SpO2 99 %.  PCP Dr. Freddi Starr.  No Known Allergies  Review of Systems  Constitutional: Positive for fever (99.5 2 days ago).  HENT: Positive for congestion, ear pain (left), rhinorrhea, sneezing, sore throat and voice change (deeper). Negative for postnasal drip, sinus pressure and sinus pain.   Eyes: Negative for discharge, itching and visual disturbance.  Respiratory: Positive for cough (productive yellow), chest tightness, shortness of breath and wheezing.   Cardiovascular: Positive for chest pain (burning and  and chest soreness from coughing). Negative for palpitations and leg swelling.  Gastrointestinal: Negative for abdominal pain, diarrhea, nausea and vomiting.  Endocrine: Negative for polydipsia, polyphagia and polyuria.  Genitourinary: Negative for dysuria.  Musculoskeletal: Negative for myalgias.  Skin: Negative for rash.  Allergic/Immunologic: Positive for environmental allergies. Negative for food allergies.  Neurological: Negative for dizziness, syncope, facial asymmetry and headaches.  Hematological: Negative for adenopathy.  Psychiatric/Behavioral: Negative for agitation, behavioral problems, confusion, self-injury and suicidal ideas.   Asthma as a kid, hard for him to catch his breath with the current illness.    Objective:   Physical Exam Vitals signs and nursing note reviewed.  Constitutional:      Appearance: Normal appearance.  HENT:     Head: Atraumatic.     Right Ear:  Ear canal and external ear normal.     Left Ear: Ear canal and external ear normal.     Nose: Nose normal.     Mouth/Throat:     Mouth: Mucous membranes are moist.     Pharynx: Oropharynx is clear.  Eyes:     Extraocular Movements: Extraocular movements intact.     Conjunctiva/sclera: Conjunctivae normal.     Pupils: Pupils are equal, round, and reactive to light.  Neck:     Musculoskeletal: Normal range of motion and neck supple.  Cardiovascular:     Rate and Rhythm: Normal rate and regular rhythm.     Heart sounds: Normal heart sounds.  Pulmonary:     Effort: Pulmonary effort is normal. No respiratory distress.     Breath sounds: Normal breath sounds. No stridor. No wheezing, rhonchi or rales.  Musculoskeletal: Normal range of motion.  Lymphadenopathy:     Cervical: No cervical adenopathy.  Skin:    General: Skin is warm and dry.  Neurological:     General: No focal deficit present.     Mental Status: He is alert and oriented to person, place, and time.  Psychiatric:        Mood and Affect: Mood normal.        Behavior: Behavior normal.        Thought Content: Thought content normal.        Judgment: Judgment normal.           Assessment & Plan:  Upper Respiratory Infection  Cough  History of Asthma , Smoker, Elevated blood pressure.not taking his Atenolol. Feels like he cannot catch his breath  with this illness.Marland Kitchen He states he only takes his Lisinopril at night and Atenolol once in a while. Recommended he restart his Atenolol on a daily basis as prescribed.  To stop Alka Seltzer due to most likely have a decongestant in it reviewed with patient, may use Plain OTC Mucinex.  Meds ordered this encounter  Medications  . benzonatate (TESSALON PERLES) 100 MG capsule    Sig: 1-2 every 8 hours as needed for cough.    Dispense:  30 capsule    Refill:  0  . albuterol (PROVENTIL HFA;VENTOLIN HFA) 108 (90 Base) MCG/ACT inhaler    Sig: Inhale 2 puffs into the lungs every 6  (six) hours as needed for wheezing or shortness of breath.    Dispense:  1 Inhaler    Refill:  0  . amoxicillin-clavulanate (AUGMENTIN) 875-125 MG tablet    Sig: Take 1 tablet by mouth 2 (two) times daily. Take with food    Dispense:  20 tablet    Refill:  0  follow up with Dr. Freddi Starr in 3-5 days if not improving. Try to decrease or stop smoking. Patient verbalizes understanding and has no questions at discharge.

## 2018-11-13 NOTE — Patient Instructions (Signed)
Cough, Adult  A cough helps to clear your throat and lungs. A cough may last only 2-3 weeks (acute), or it may last longer than 8 weeks (chronic). Many different things can cause a cough. A cough may be a sign of an illness or another medical condition. Follow these instructions at home:  Pay attention to any changes in your cough.  Take medicines only as told by your doctor. ? If you were prescribed an antibiotic medicine, take it as told by your doctor. Do not stop taking it even if you start to feel better. ? Talk with your doctor before you try using a cough medicine.  Drink enough fluid to keep your pee (urine) clear or pale yellow.  If the air is dry, use a cold steam vaporizer or humidifier in your home.  Stay away from things that make you cough at work or at home.  If your cough is worse at night, try using extra pillows to raise your head up higher while you sleep.  Do not smoke, and try not to be around smoke. If you need help quitting, ask your doctor.  Do not have caffeine.  Do not drink alcohol.  Rest as needed. Contact a doctor if:  You have new problems (symptoms).  You cough up yellow fluid (pus).  Your cough does not get better after 2-3 weeks, or your cough gets worse.  Medicine does not help your cough and you are not sleeping well.  You have pain that gets worse or pain that is not helped with medicine.  You have a fever.  You are losing weight and you do not know why.  You have night sweats. Get help right away if:  You cough up blood.  You have trouble breathing.  Your heartbeat is very fast. This information is not intended to replace advice given to you by your health care provider. Make sure you discuss any questions you have with your health care provider. Document Released: 06/22/2011 Document Revised: 03/16/2016 Document Reviewed: 12/16/2014 Elsevier Interactive Patient Education  2019 [place]. Upper Respiratory Infection,  Adult An upper respiratory infection (URI) affects the nose, throat, and upper air passages. URIs are caused by germs (viruses). The most common type of URI is often called "the common cold." Medicines cannot cure URIs, but you can do things at home to relieve your symptoms. URIs usually get better within 7-10 days. Follow these instructions at home: Activity  Rest as needed.  If you have a fever, stay home from work or school until your fever is gone, or until your doctor says you may return to work or school. ? You should stay home until you cannot spread the infection anymore (you are not contagious). ? Your doctor may have you wear a face mask so you have less risk of spreading the infection. Relieving symptoms  Gargle with a salt-water mixture 3-4 times a day or as needed. To make a salt-water mixture, completely dissolve -1 tsp of salt in 1 cup of warm water.  Use a cool-mist humidifier to add moisture to the air. This can help you breathe more easily. Eating and drinking   Drink enough fluid to keep your pee (urine) pale yellow.  Eat soups and other clear broths. General instructions   Take over-the-counter and prescription medicines only as told by your doctor. These include cold medicines, fever reducers, and cough suppressants.  Do not use any products that contain nicotine or tobacco. These include cigarettes and e-cigarettes.  If you need help quitting, ask your doctor.  Avoid being where people are smoking (avoid secondhand smoke).  Make sure you get regular shots and get the flu shot every year.  Keep all follow-up visits as told by your doctor. This is important. How to avoid spreading infection to others   Wash your hands often with soap and water. If you do not have soap and water, use hand sanitizer.  Avoid touching your mouth, face, eyes, or nose.  Cough or sneeze into a tissue or your sleeve or elbow. Do not cough or sneeze into your hand or into the  air. Contact a doctor if:  You are getting worse, not better.  You have any of these: ? A fever. ? Chills. ? Brown or red mucus in your nose. ? Yellow or brown fluid (discharge)coming from your nose. ? Pain in your face, especially when you bend forward. ? Swollen neck glands. ? Pain with swallowing. ? White areas in the back of your throat. Get help right away if:  You have shortness of breath that gets worse.  You have very bad or constant: ? Headache. ? Ear pain. ? Pain in your forehead, behind your eyes, and over your cheekbones (sinus pain). ? Chest pain.  You have long-lasting (chronic) lung disease along with any of these: ? Wheezing. ? Long-lasting cough. ? Coughing up blood. ? A change in your usual mucus.  You have a stiff neck.  You have changes in your: ? Vision. ? Hearing. ? Thinking. ? Mood. Summary  An upper respiratory infection (URI) is caused by a germ called a virus. The most common type of URI is often called "the common cold."  URIs usually get better within 7-10 days.  Take over-the-counter and prescription medicines only as told by your doctor. This information is not intended to replace advice given to you by your health care provider. Make sure you discuss any questions you have with your health care provider. Document Released: 03/27/2008 Document Revised: 06/01/2017 Document Reviewed: 06/01/2017 Elsevier Interactive Patient Education  2019 [employer].

## 2018-12-05 ENCOUNTER — Other Ambulatory Visit: Payer: Self-pay | Admitting: Medical

## 2018-12-05 DIAGNOSIS — R059 Cough, unspecified: Secondary | ICD-10-CM

## 2018-12-05 DIAGNOSIS — R05 Cough: Secondary | ICD-10-CM

## 2018-12-26 ENCOUNTER — Other Ambulatory Visit: Payer: Self-pay | Admitting: Family Medicine

## 2018-12-26 DIAGNOSIS — I10 Essential (primary) hypertension: Secondary | ICD-10-CM

## 2019-01-16 ENCOUNTER — Telehealth: Payer: Self-pay

## 2019-01-16 NOTE — Telephone Encounter (Signed)
Patient called c/o headache, nasal drainage and pain around right eye for 3 days.  Denies any fever, cough or shortness of breath.  Instructed patient to pick up 1) Zican for nasal congestion and sinus relief and 2) Flonase nasal spray one spray  bid .  Patient will call us back if no relief within 24-48 hours.

## 2019-01-16 NOTE — Telephone Encounter (Signed)
Yes, I am in agreeance as this is what was advised per myself after reviewing chart and triage information. They are to call or follow up with office if this plan is not effective. I was present and they are aware to get "Zicam nasal congestion and sinus"

## 2019-02-14 ENCOUNTER — Other Ambulatory Visit: Payer: Self-pay

## 2019-02-14 ENCOUNTER — Encounter: Payer: Self-pay | Admitting: Family Medicine

## 2019-02-14 ENCOUNTER — Ambulatory Visit (INDEPENDENT_AMBULATORY_CARE_PROVIDER_SITE_OTHER): Payer: BLUE CROSS/BLUE SHIELD | Admitting: Family Medicine

## 2019-02-14 VITALS — BP 150/90 | HR 71 | Temp 98.6°F | Resp 16 | Wt 183.4 lb

## 2019-02-14 DIAGNOSIS — F411 Generalized anxiety disorder: Secondary | ICD-10-CM | POA: Diagnosis not present

## 2019-02-14 DIAGNOSIS — J4521 Mild intermittent asthma with (acute) exacerbation: Secondary | ICD-10-CM | POA: Diagnosis not present

## 2019-02-14 DIAGNOSIS — I10 Essential (primary) hypertension: Secondary | ICD-10-CM

## 2019-02-14 MED ORDER — CITALOPRAM HYDROBROMIDE 20 MG PO TABS: 20.0000 mg | ORAL_TABLET | Freq: Every day | ORAL | 3 refills | Status: DC

## 2019-02-14 MED ORDER — ALBUTEROL SULFATE HFA 108 (90 BASE) MCG/ACT IN AERS: 2.0000 | INHALATION_SPRAY | Freq: Four times a day (QID) | RESPIRATORY_TRACT | 2 refills | Status: DC | PRN

## 2019-02-14 NOTE — Progress Notes (Signed)
Patient: Derek [name] Male    DOB: Jun 03, 1966   53 y.o.   MRN: [id number] Visit Date: 02/14/2019  Today's Provider: Vernie Murders, PA   Chief Complaint  Patient presents with  . Shortness of Breath   Subjective:     Shortness of Breath  This is a new problem. The current episode started 1 to 4 weeks ago. The problem occurs daily. The problem has been unchanged. Pertinent negatives include no abdominal pain, chest pain, ear pain, fever, headaches, hemoptysis, leg pain, leg swelling, neck pain, orthopnea, PND, rash, rhinorrhea, sore throat, sputum production, swollen glands, syncope, vomiting or wheezing. Associated symptoms comments: Sinus pain around right side of face . Treatments tried: Flonase. The treatment provided no relief. His past medical history is significant for allergies and asthma (as a child). There is no history of CAD, chronic lung disease, COPD, DVT, a heart failure, PE, pneumonia or a recent surgery.   Past Medical History:  Diagnosis Date  . Gout   . Hypertension    Past Surgical History:  Procedure Laterality Date  . CERVICAL DISC SURGERY    . COLONOSCOPY WITH PROPOFOL N/A 02/18/2018   Procedure: COLONOSCOPY WITH PROPOFOL;  Surgeon: Lin Landsman, MD;  Location: [facility] ENDOSCOPY;  Service: Gastroenterology;  Laterality: N/A;  . TONSILLECTOMY AND ADENOIDECTOMY     Family History  Problem Relation Age of Onset  . Fibromyalgia Mother   . Prostate cancer Father   . Hypertension Father   . Healthy Sister    No Known Allergies  Current Outpatient Medications:  .  atenolol (TENORMIN) 50 MG tablet, Take 1 tablet (50 mg total) by mouth daily., Disp: 90 tablet, Rfl: 3 .  lisinopril (PRINIVIL,ZESTRIL) 20 MG tablet, TAKE 1 AND 1/2 TABLETS (30 MG TOTAL) BY MOUTH AT BEDTIME, Disp: 135 tablet, Rfl: 0 .  allopurinol (ZYLOPRIM) 100 MG tablet, TAKE 2 TABLETS (200 MG TOTAL) BY MOUTH ONCE DAILY. (Patient not taking: Reported on 02/14/2019), Disp: 180 tablet, Rfl:  3 .  nortriptyline (PAMELOR) 25 MG capsule, Take one to two at bedtime (Patient not taking: Reported on 02/14/2019), Disp: 180 capsule, Rfl: 1 .  sildenafil (VIAGRA) 100 MG tablet, Take one tablet by mouth 1-4 hours prior to intercourse. Limit one tablet per day. (Patient not taking: Reported on 02/14/2019), Disp: 10 tablet, Rfl: 2  Review of Systems  Constitutional: Negative for fever.  HENT: Negative for ear pain, rhinorrhea and sore throat.   Respiratory: Positive for shortness of breath. Negative for hemoptysis, sputum production and wheezing.   Cardiovascular: Negative for chest pain, orthopnea, leg swelling, syncope and PND.  Gastrointestinal: Negative for abdominal pain and vomiting.  Musculoskeletal: Negative for neck pain.  Skin: Negative for rash.  Neurological: Negative for headaches.   Social History   Tobacco Use  . Smoking status: Former Smoker    Packs/day: 0.25    Types: Cigarettes    Last attempt to quit: 10/23/2018    Years since quitting: 0.3  . Smokeless tobacco: Never Used  . Tobacco comment: 1 pack every 2-3 days  Substance Use Topics  . Alcohol use: Yes    Comment: occasionally     Objective:   BP (!) 150/90   Pulse 71   Temp 98.6 F (37 C) (Oral)   Resp 16   Wt 183 lb 6.4 oz (83.2 kg)   SpO2 98%   BMI 27.08 kg/m    BP Readings from Last 3 Encounters:  02/14/19 (!) 150/90  11/13/18 (!) 152/89  05/21/18 (!) 128/92   Vitals:   02/14/19 0823  BP: (!) 150/90  Pulse: 71  Resp: 16  Temp: 98.6 F (37 C)  TempSrc: Oral  SpO2: 98%  Weight: 183 lb 6.4 oz (83.2 kg)   Physical Exam Constitutional:      General: He is not in acute distress.    Appearance: He is well-developed. He is not ill-appearing.  HENT:     Head: Normocephalic and atraumatic.     Right Ear: Hearing normal.     Left Ear: Hearing normal.     Nose: Nose normal.     Mouth/Throat:     Mouth: Mucous membranes are moist.     Pharynx: Oropharynx is clear.  Eyes:     General:  Lids are normal. No scleral icterus.       Right eye: No discharge.        Left eye: No discharge.     Extraocular Movements: Extraocular movements intact.     Conjunctiva/sclera: Conjunctivae normal.  Neck:     Musculoskeletal: Neck supple.  Cardiovascular:     Rate and Rhythm: Normal rate and regular rhythm.  Pulmonary:     Effort: Pulmonary effort is normal. No respiratory distress.     Breath sounds: Normal breath sounds. No decreased breath sounds, wheezing, rhonchi or rales.  Musculoskeletal: Normal range of motion.  Skin:    Findings: No lesion or rash.  Neurological:     Mental Status: He is alert and oriented to person, place, and time.  Psychiatric:        Mood and Affect: Mood is anxious.        Speech: Speech normal.        Behavior: Behavior normal.        Thought Content: Thought content normal.       Assessment & Plan    1. Mild intermittent reactive airway disease with wheezing with acute exacerbation Has had some episodes of shortness of breath associated with anxiety. No cough, congestion, fever or exposure to anyone with COVID-19. Spirometry normal. Has had some wheezing he hears at night. Stopped all smoking 10-23-18. Will check CBC with diff and give a refill of Albuterol inhaler. Call or recheck if any congestion or fever develops. - CBC with Differential/Platelet - Spirometry with graph - albuterol (VENTOLIN HFA) 108 (90 Base) MCG/ACT inhaler; Inhale 2 puffs into the lungs every 6 (six) hours as needed for wheezing or shortness of breath.  Dispense: 1 Inhaler; Refill: 2  2. Generalized anxiety disorder Having frequent anxiety with being in any confined space (I.e. - elevator, a crowd or lying down in a dentist chair). Will give trial of Celexa and encouraged to exercise regularly. Will check TSH and limit caffeine consumption. - citalopram (CELEXA) 20 MG tablet; Take 1 tablet (20 mg total) by mouth daily.  Dispense: 30 tablet; Refill: 3 - TSH  3. Essential  (primary) hypertension BP elevated today with pandemic stress and anxiety reaction. Still taking Atenolol 50 mg qd with Lisinopril 20 mg qd. Continue to limit caffeine and salt intake. Recheck CBC and CMP.  - CBC with Differential/Platelet - Comprehensive metabolic panel    I,Kathleen J Wolford,acting as a Education administrator for [employer], [employer].,have documented all relevant documentation on the behalf of [employer],as directed by  [employer] while in the presence of [employer], [place].  Vernie Murders, PA  [facility]

## 2019-02-15 LAB — COMPREHENSIVE METABOLIC PANEL
ALT: 17 IU/L (ref 0–44)
AST: 22 IU/L (ref 0–40)
Albumin/Globulin Ratio: 2.2 (ref 1.2–2.2)
Albumin: 4.8 g/dL (ref 3.8–4.9)
Alkaline Phosphatase: 43 IU/L (ref 39–117)
BUN/Creatinine Ratio: 11 (ref 9–20)
BUN: 11 mg/dL (ref 6–24)
Bilirubin Total: 0.3 mg/dL (ref 0.0–1.2)
CO2: 21 mmol/L (ref 20–29)
Calcium: 9.3 mg/dL (ref 8.7–10.2)
Chloride: 106 mmol/L (ref 96–106)
Creatinine, Ser: 1 mg/dL (ref 0.76–1.27)
GFR calc Af Amer: 100 mL/min/{1.73_m2} (ref >59)
GFR calc non Af Amer: 86 mL/min/{1.73_m2} (ref >59)
Globulin, Total: 2.2 g/dL (ref 1.5–4.5)
Glucose: 95 mg/dL (ref 65–99)
Potassium: 4.6 mmol/L (ref 3.5–5.2)
Sodium: 142 mmol/L (ref 134–144)
Total Protein: 7 g/dL (ref 6.0–8.5)

## 2019-02-15 LAB — TSH: TSH: 1.12 u[IU]/mL (ref 0.450–4.500)

## 2019-02-15 LAB — CBC WITH DIFFERENTIAL/PLATELET
Basophils Absolute: 0 10*3/uL (ref 0.0–0.2)
Basos: 1 %
EOS (ABSOLUTE): 0.6 10*3/uL — ABNORMAL HIGH (ref 0.0–0.4)
Eos: 8 %
Hematocrit: 40.2 % (ref 37.5–51.0)
Hemoglobin: 13.9 g/dL (ref 13.0–17.7)
Immature Grans (Abs): 0 10*3/uL (ref 0.0–0.1)
Immature Granulocytes: 0 %
Lymphocytes Absolute: 2 10*3/uL (ref 0.7–3.1)
Lymphs: 26 %
MCH: 31.4 pg (ref 26.6–33.0)
MCHC: 34.6 g/dL (ref 31.5–35.7)
MCV: 91 fL (ref 79–97)
Monocytes Absolute: 0.6 10*3/uL (ref 0.1–0.9)
Monocytes: 8 %
Neutrophils Absolute: 4.4 10*3/uL (ref 1.4–7.0)
Neutrophils: 57 %
Platelets: 225 10*3/uL (ref 150–450)
RBC: 4.42 x10E6/uL (ref 4.14–5.80)
RDW: 13 % (ref 11.6–15.4)
WBC: 7.6 10*3/uL (ref 3.4–10.8)

## 2019-02-21 ENCOUNTER — Telehealth: Payer: Self-pay

## 2019-02-21 ENCOUNTER — Other Ambulatory Visit: Payer: Self-pay | Admitting: Family Medicine

## 2019-02-21 DIAGNOSIS — J4521 Mild intermittent asthma with (acute) exacerbation: Secondary | ICD-10-CM

## 2019-02-21 MED ORDER — PREDNISONE 5 MG PO TABS: ORAL_TABLET | ORAL | 0 refills | Status: DC

## 2019-02-21 NOTE — Telephone Encounter (Signed)
Monitor temperature, check BP and set up virtual visit. May need prednisone taper to settle asthma/wheezing.

## 2019-02-21 NOTE — Telephone Encounter (Signed)
Stay home, practice social distancing, wash hands frequently, use facial covering if leaving the house, increase fluid intake, use Albuterol inhaler prn and will add the prednisone taper (sent to the [facility]). Check temperature daily and call report of readings Monday. If temp goes above 100.4 consistently, cough worsens, shortness of breath not controlled and flu-like symptoms start up, may need to go to the ER to be screened for COVID-19 or chest x-ray to rule out pneumonia.

## 2019-02-21 NOTE — Telephone Encounter (Signed)
Advised patient as below.  

## 2019-02-21 NOTE — Telephone Encounter (Signed)
Patient states that he still having shortness of breath.

## 2019-02-21 NOTE — Telephone Encounter (Signed)
Advised patient as below. Offered e visit and patient reports that he does not have the software that is needed on his computer to do the visit. He also reports that he does not have the money for another copay at the moment. Please advise. Thanks!

## 2019-03-04 ENCOUNTER — Telehealth: Payer: Self-pay

## 2019-03-04 NOTE — Telephone Encounter (Signed)
Patient states he did well when on the Prednisone.  He did go back and smoke 2 cigarettes and noticed that it made him short of breath again.  He states he is not worse than before but he is having some trouble during the day.  In the morning and in the evening his breathing is ok.  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  When asked if the breathing problem was worse with exertion he said he really didn't know, he just knows that it is worse during the day.

## 2019-03-04 NOTE — Telephone Encounter (Signed)
Would recommend trial of Breo 200-25 mcg/inhalation 1 inhalation qd or Advair Diskus 250-50 mcg/inhalation 1 inhalation twice a day that have long acting bronchodilator with steroid in them. Recheck appointment should be scheduled to assess progress in 10-14 days.

## 2019-03-04 NOTE — Telephone Encounter (Signed)
Patients wife called office stating that she had concerns about patients condition not improving. Patient was seen in office on 02/14/19 with complaints of shortness of breath. Wife states that patient was prescribed prednisone and inhaler but shortness of breath persists and stats that patient has a cough she has noticed as well. Please advise. KW

## 2019-03-06 NOTE — Telephone Encounter (Signed)
Patienit advised and appointment has been made.

## 2019-03-08 ENCOUNTER — Other Ambulatory Visit: Payer: Self-pay | Admitting: Family Medicine

## 2019-03-08 DIAGNOSIS — F411 Generalized anxiety disorder: Secondary | ICD-10-CM

## 2019-03-10 NOTE — Telephone Encounter (Signed)
Please review

## 2019-03-18 ENCOUNTER — Ambulatory Visit: Payer: BLUE CROSS/BLUE SHIELD | Admitting: Family Medicine

## 2019-03-18 ENCOUNTER — Encounter: Payer: Self-pay | Admitting: Family Medicine

## 2019-03-18 VITALS — BP 128/82 | HR 73 | Temp 97.9°F | Wt 184.0 lb

## 2019-03-18 DIAGNOSIS — M791 Myalgia, unspecified site: Secondary | ICD-10-CM

## 2019-03-18 DIAGNOSIS — J4530 Mild persistent asthma, uncomplicated: Secondary | ICD-10-CM | POA: Diagnosis not present

## 2019-03-18 MED ORDER — BECLOMETHASONE DIPROP HFA 40 MCG/ACT IN AERB: 2.0000 | INHALATION_SPRAY | Freq: Two times a day (BID) | RESPIRATORY_TRACT | 0 refills | Status: DC

## 2019-03-18 NOTE — Progress Notes (Signed)
Derek [name]  MRN: [id number] DOB: August 15, 1966  Subjective:  HPI   The patient is a 53 year old male who presents today for follow up of his reactive airway disease with wheezing exacerbation.  He was seen on 02/14/19 and at that time he was instructed to use Albuterol inhaler every 4 hours as needed.   The patient was also diagnosed at that time with generalized anxiety disorder and a trial of Citalopram was initiated.   Patient has CBC, TSH and Met C done in that visit and all were WNL.  Spirometry was WNL as well.  Patient reports his symptoms are about the same.  He said they may be slightly improved.  He has days that he doesn't have any problems and then other days that he feels he can't breath when he is exerting himself.  He also complains of leg pain bilaterally in the early morning hours with improvement as the day progresses.   Patient Active Problem List   Diagnosis Date Noted  . Special screening for malignant neoplasms, colon   . Adrenal mass ([place]) 01/03/2016  . Cervical nerve root disorder 01/03/2016  . Displacement of cervical intervertebral disc without myelopathy 01/03/2016  . Erectile dysfunction 01/03/2016  . Family history of malignant neoplasm of prostate 01/03/2016  . H/O arthrodesis 01/03/2016  . Gout 01/03/2016  . Headache, migraine 01/03/2016  . Calculus of kidney 01/03/2016  . Backhand tennis elbow 01/03/2016  . Disordered sleep 06/23/2014  . B12 deficiency 06/23/2014  . Degeneration of cervical intervertebral disc 01/25/2010  . Lipoma of skin 07/21/2009  . Essential (primary) hypertension 11/25/2007  . Awareness of heartbeats 11/25/2007  . Compulsive tobacco user syndrome 11/25/2007   Past Medical History:  Diagnosis Date  . Gout   . Hypertension    Past Surgical History:  Procedure Laterality Date  . CERVICAL DISC SURGERY    . COLONOSCOPY WITH PROPOFOL N/A 02/18/2018   Procedure: COLONOSCOPY WITH PROPOFOL;  Surgeon: Lin Landsman, MD;   Location: [facility] ENDOSCOPY;  Service: Gastroenterology;  Laterality: N/A;  . TONSILLECTOMY AND ADENOIDECTOMY     Family History  Problem Relation Age of Onset  . Fibromyalgia Mother   . Prostate cancer Father   . Hypertension Father   . Healthy Sister    Social History   Socioeconomic History  . Marital status: Divorced    Spouse name: Not on file  . Number of children: Not on file  . Years of education: Not on file  . Highest education level: Not on file  Occupational History  . Not on file  Social Needs  . Financial resource strain: Not on file  . Food insecurity:    Worry: Not on file    Inability: Not on file  . Transportation needs:    Medical: Not on file    Non-medical: Not on file  Tobacco Use  . Smoking status: Former Smoker    Packs/day: 0.25    Types: Cigarettes    Last attempt to quit: 10/23/2018    Years since quitting: 0.4  . Smokeless tobacco: Never Used  . Tobacco comment: 1 pack every 2-3 days  Substance and Sexual Activity  . Alcohol use: Yes    Comment: occasionally  . Drug use: No  . Sexual activity: Not on file  Lifestyle  . Physical activity:    Days per week: Not on file    Minutes per session: Not on file  . Stress: Not on file  Relationships  .  Social connections:    Talks on phone: Not on file    Gets together: Not on file    Attends religious service: Not on file    Active member of club or organization: Not on file    Attends meetings of clubs or organizations: Not on file    Relationship status: Not on file  . Intimate partner violence:    Fear of current or ex partner: Not on file    Emotionally abused: Not on file    Physically abused: Not on file    Forced sexual activity: Not on file  Other Topics Concern  . Not on file  Social History Narrative  . Not on file   Outpatient Encounter Medications as of 03/18/2019  Medication Sig  . albuterol (VENTOLIN HFA) 108 (90 Base) MCG/ACT inhaler Inhale 2 puffs into the lungs every 6  (six) hours as needed for wheezing or shortness of breath.  Marland Kitchen atenolol (TENORMIN) 50 MG tablet Take 1 tablet (50 mg total) by mouth daily.  . citalopram (CELEXA) 20 MG tablet TAKE 1 TABLET BY MOUTH EVERY DAY  . lisinopril (PRINIVIL,ZESTRIL) 20 MG tablet TAKE 1 AND 1/2 TABLETS (30 MG TOTAL) BY MOUTH AT BEDTIME  . allopurinol (ZYLOPRIM) 100 MG tablet TAKE 2 TABLETS (200 MG TOTAL) BY MOUTH ONCE DAILY. (Patient not taking: Reported on 02/14/2019)  . sildenafil (VIAGRA) 100 MG tablet Take one tablet by mouth 1-4 hours prior to intercourse. Limit one tablet per day. (Patient not taking: Reported on 02/14/2019)  . [DISCONTINUED] predniSONE (DELTASONE) 5 MG tablet Taper dose by 1 tablet daily starting at 6 by mouth day 1 (6,5,4,3,2,1) (Patient not taking: Reported on 03/18/2019)   No facility-administered encounter medications on file as of 03/18/2019.    No Known Allergies  Review of Systems  Constitutional: Negative for fever.  Respiratory: Positive for shortness of breath and wheezing. Negative for cough.   Musculoskeletal: Positive for myalgias.    Objective:  BP 128/82 (BP Location: Right Arm, Patient Position: Sitting, Cuff Size: Normal)   Pulse 73   Temp 97.9 F (36.6 C) (Oral)   Wt 184 lb (83.5 kg)   SpO2 97%   BMI 27.17 kg/m   Physical Exam  Constitutional: He is oriented to person, place, and time and well-developed, well-nourished, and in no distress.  HENT:  Head: Normocephalic.  Eyes: Conjunctivae are normal.  Neck: Neck supple.  Cardiovascular: Normal rate.  Pulmonary/Chest: Effort normal and breath sounds normal.  Abdominal: Soft. Bowel sounds are normal.  Musculoskeletal: Normal range of motion.        General: No tenderness or edema.  Neurological: He is alert and oriented to person, place, and time. He has normal reflexes.  Skin: No rash noted.  Psychiatric: Mood, affect and judgment normal.    Assessment and Plan :  1. Mild persistent reactive airway disease without  complication Using Albuterol 1-2 times a day for wheezing and shortness of breath. No cough, congestion or fever. Smoking less than 1/2 ppd now. Will given QVAR inhaler and hold Albuterol-HFA inhaler for prn rescue. Stop all smoking and recheck if no better in a month. - beclomethasone (QVAR REDIHALER) 40 MCG/ACT inhaler; Inhale 2 puffs into the lungs 2 (two) times daily.  Dispense: 1 Inhaler; Refill: 0  2. Myalgia Aching in calves early morning that resolves as soon as he gets up to walk. No known injury or swelling. Good pulses bilaterally with normal DTR's. Normal strength without numbness. May try Tonic Water with  Apple Juice 4-6 ounces each evening. Recheck if no better in 1-2 weeks.

## 2019-03-22 ENCOUNTER — Other Ambulatory Visit: Payer: Self-pay | Admitting: Family Medicine

## 2019-03-22 DIAGNOSIS — I10 Essential (primary) hypertension: Secondary | ICD-10-CM

## 2019-04-03 ENCOUNTER — Other Ambulatory Visit: Payer: Self-pay

## 2019-04-03 ENCOUNTER — Ambulatory Visit: Payer: BC Managed Care – PPO | Admitting: Family Medicine

## 2019-04-03 ENCOUNTER — Encounter: Payer: Self-pay | Admitting: Family Medicine

## 2019-04-03 VITALS — BP 138/84 | HR 100 | Temp 98.8°F | Wt 186.0 lb

## 2019-04-03 DIAGNOSIS — N492 Inflammatory disorders of scrotum: Secondary | ICD-10-CM

## 2019-04-03 MED ORDER — CEPHALEXIN 500 MG PO CAPS: 500.0000 mg | ORAL_CAPSULE | Freq: Four times a day (QID) | ORAL | 0 refills | Status: DC

## 2019-04-03 NOTE — Progress Notes (Signed)
Derek [name]  MRN: [id number] DOB: Mar 20, 1966  Subjective:  HPI   The patient is a 53 year old male who presents for evaluation of left testicular swelling and pain and pain in the left groin.  He states is started about 4 days ago.  He feels that it may have pus in it but will not drain.  He denies any urinary symptoms.  Patient Active Problem List   Diagnosis Date Noted   Special screening for malignant neoplasms, colon    Adrenal mass ([place]) 01/03/2016   Cervical nerve root disorder 01/03/2016   Displacement of cervical intervertebral disc without myelopathy 01/03/2016   Erectile dysfunction 01/03/2016   Family history of malignant neoplasm of prostate 01/03/2016   H/O arthrodesis 01/03/2016   Gout 01/03/2016   Headache, migraine 01/03/2016   Calculus of kidney 01/03/2016   Backhand tennis elbow 01/03/2016   Disordered sleep 06/23/2014   B12 deficiency 06/23/2014   Degeneration of cervical intervertebral disc 01/25/2010   Lipoma of skin 07/21/2009   Essential (primary) hypertension 11/25/2007   Awareness of heartbeats 11/25/2007   Compulsive tobacco user syndrome 11/25/2007   Past Medical History:  Diagnosis Date   Gout    Hypertension    Past Surgical History:  Procedure Laterality Date   CERVICAL DISC SURGERY     COLONOSCOPY WITH PROPOFOL N/A 02/18/2018   Procedure: COLONOSCOPY WITH PROPOFOL;  Surgeon: Lin Landsman, MD;  Location: [facility] ENDOSCOPY;  Service: Gastroenterology;  Laterality: N/A;   TONSILLECTOMY AND ADENOIDECTOMY     Family History  Problem Relation Age of Onset   Fibromyalgia Mother    Prostate cancer Father    Hypertension Father    Healthy Sister    Social History   Socioeconomic History   Marital status: Divorced    Spouse name: Not on file   Number of children: Not on file   Years of education: Not on file   Highest education level: Not on file  Occupational History   Not on file  Social Needs    Financial resource strain: Not on file   Food insecurity    Worry: Not on file    Inability: Not on file   Transportation needs    Medical: Not on file    Non-medical: Not on file  Tobacco Use   Smoking status: Former Smoker    Packs/day: 0.25    Types: Cigarettes    Quit date: 10/23/2018    Years since quitting: 0.4   Smokeless tobacco: Never Used   Tobacco comment: 1 pack every 2-3 days  Substance and Sexual Activity   Alcohol use: Yes    Comment: occasionally   Drug use: No   Sexual activity: Not on file  Lifestyle   Physical activity    Days per week: Not on file    Minutes per session: Not on file   Stress: Not on file  Relationships   Social connections    Talks on phone: Not on file    Gets together: Not on file    Attends religious service: Not on file    Active member of club or organization: Not on file    Attends meetings of clubs or organizations: Not on file    Relationship status: Not on file   Intimate partner violence    Fear of current or ex partner: Not on file    Emotionally abused: Not on file    Physically abused: Not on file    Forced sexual  activity: Not on file  Other Topics Concern   Not on file  Social History Narrative   Not on file    Outpatient Encounter Medications as of 04/03/2019  Medication Sig   albuterol (VENTOLIN HFA) 108 (90 Base) MCG/ACT inhaler Inhale 2 puffs into the lungs every 6 (six) hours as needed for wheezing or shortness of breath.   allopurinol (ZYLOPRIM) 100 MG tablet TAKE 2 TABLETS (200 MG TOTAL) BY MOUTH ONCE DAILY. (Patient not taking: Reported on 02/14/2019)   atenolol (TENORMIN) 50 MG tablet Take 1 tablet (50 mg total) by mouth daily.   beclomethasone (QVAR REDIHALER) 40 MCG/ACT inhaler Inhale 2 puffs into the lungs 2 (two) times daily.   citalopram (CELEXA) 20 MG tablet TAKE 1 TABLET BY MOUTH EVERY DAY   lisinopril (ZESTRIL) 20 MG tablet TAKE 1 AND 1/2 TABLETS (30 MG TOTAL) BY MOUTH AT BEDTIME     sildenafil (VIAGRA) 100 MG tablet Take one tablet by mouth 1-4 hours prior to intercourse. Limit one tablet per day. (Patient not taking: Reported on 02/14/2019)   No facility-administered encounter medications on file as of 04/03/2019.    No Known Allergies  Review of Systems  Genitourinary: Negative for dysuria and frequency.       Left testicular pain and swelling    Objective:  BP 138/84 (BP Location: Right Arm, Patient Position: Sitting, Cuff Size: Normal)    Pulse 100    Temp 98.8 F (37.1 C) (Oral)    Wt 186 lb (84.4 kg)    SpO2 97%    BMI 27.47 kg/m   Physical Exam  Constitutional: He is oriented to person, place, and time and well-developed, well-nourished, and in no distress.  HENT:  Head: Normocephalic.  Eyes: Conjunctivae are normal.  Neck: Neck supple.  Pulmonary/Chest: Effort normal.  Abdominal: Soft.  Genitourinary:    Penis normal.     Genitourinary Comments: Red left upper scrotum with 3-4 mm black scab in a large erythematous swollen area. Very tender to palpate. Does not involve the testicles or penis.   Musculoskeletal: Normal range of motion.  Neurological: He is alert and oriented to person, place, and time.  Skin: No rash noted.  Psychiatric: Mood, affect and judgment normal.    Assessment and Plan :  1. Abscess of scrotal wall Developed tenderness and redness in the left upper scrotum with some radiation of discomfort into the left groin over the past 2-3 days. No known injury or insect bite. Has been shaving the area and had a black scab in the center of the erythema anteriorly. No testicular swelling or tenderness. Will treat scrotal wall abscess with Keflex 500 mg QID and may soak in hot Epsom Saltwater bath. Recheck in 3 days if no better. - cephALEXin (KEFLEX) 500 MG capsule; Take 1 capsule (500 mg total) by mouth 4 (four) times daily.  Dispense: 30 capsule; Refill: 0

## 2019-04-24 ENCOUNTER — Other Ambulatory Visit: Payer: Self-pay | Admitting: Family Medicine

## 2019-04-24 DIAGNOSIS — J4521 Mild intermittent asthma with (acute) exacerbation: Secondary | ICD-10-CM

## 2019-08-09 ENCOUNTER — Other Ambulatory Visit: Payer: Self-pay | Admitting: Family Medicine

## 2019-08-09 DIAGNOSIS — J4521 Mild intermittent asthma with (acute) exacerbation: Secondary | ICD-10-CM

## 2019-08-19 ENCOUNTER — Ambulatory Visit: Payer: Self-pay

## 2019-08-19 ENCOUNTER — Other Ambulatory Visit: Payer: Self-pay

## 2019-08-19 DIAGNOSIS — Z23 Encounter for immunization: Secondary | ICD-10-CM

## 2019-10-30 ENCOUNTER — Other Ambulatory Visit: Payer: Self-pay | Admitting: Family Medicine

## 2019-10-30 DIAGNOSIS — J4521 Mild intermittent asthma with (acute) exacerbation: Secondary | ICD-10-CM

## 2019-10-30 NOTE — Telephone Encounter (Signed)
Requested medication (s) are due for refill today: no  Requested medication (s) are on the active medication list: yes  Last refill: 10/04/2019  Future visit scheduled: no  Notes to clinic:  One inhaler should last at least one month. If the patient is requesting refills earlier, contact the patient to check for uncontrolled symptoms  Requested Prescriptions  Pending Prescriptions Disp Refills   VENTOLIN HFA 108 (90 Base) MCG/ACT inhaler [Pharmacy Med Name: VENTOLIN HFA 90 MCG INHALER]  2    Sig: TAKE 2 PUFFS BY MOUTH EVERY 6 HOURS AS NEEDED FOR WHEEZE OR SHORTNESS OF BREATH      Pulmonology:  [facility]ists Failed - 10/30/2019  1:00 AM      Failed - One inhaler should last at least one month. If the patient is requesting refills earlier, contact the patient to check for uncontrolled symptoms.      Passed - Valid encounter within last 12 months    Recent Outpatient Visits           7 months ago Abscess of scrotal wall   [facility] [place], [place], [place]   7 months ago Mild persistent reactive airway disease without complication   [facility], [place], [place]   8 months ago Mild intermittent reactive airway disease with wheezing with acute exacerbation   [place], [place]   1 year ago Migraine without aura and without status migrainosus, not intractable   [place], [place], [place]   1 year ago Viral upper respiratory tract infection   [place], [place], [place]

## 2019-12-05 ENCOUNTER — Other Ambulatory Visit: Payer: Self-pay | Admitting: Family Medicine

## 2019-12-05 DIAGNOSIS — F411 Generalized anxiety disorder: Secondary | ICD-10-CM

## 2019-12-24 ENCOUNTER — Ambulatory Visit: Payer: Self-pay | Admitting: *Deleted

## 2019-12-24 ENCOUNTER — Telehealth: Payer: Self-pay

## 2019-12-24 NOTE — Telephone Encounter (Signed)
Reporting he has chronic SOB, last seen for this 03/17/20. Does not seem to be worsening but not improving. Occurs with/without exertion. Occasional wheezing Denies CP. Using inhaler 3-4x daily Ventolin HFA 108 (90 Base). Requesting pulmonary referral and alternative rescue inhaler due to its rising cost, recently. Denies all other symptoms and will have a rapid Covid 19 test performed at work [employer]) tomorrow, prior to this visit on Friday. Scheduled with Vernie Murders, PA on 12/26/19.  Reason for Disposition . [1] MODERATE longstanding difficulty breathing (e.g., speaks in phrases, SOB even at rest, pulse 100-120) AND [2] SAME as normal  Answer Assessment - Initial Assessment Questions 1. RESPIRATORY STATUS: "Describe your breathing?" (e.g., wheezing, shortness of breath, unable to speak, severe coughing)     Occasional wheezing, gives out of breath easily 2. ONSET: "When did this breathing problem begin?" about a year ago.     3. PATTERN "Does the difficult breathing come and go, or has it been constant since it started?"      Comes and goes. 4. SEVERITY: "How bad is your breathing?" (e.g., mild, moderate, severe)    - MILD: No SOB at rest, mild SOB with walking, speaks normally in sentences, can lay down, no retractions, pulse < 100.    - MODERATE: SOB at rest, SOB with minimal exertion and prefers to sit, cannot lie down flat, speaks in phrases, mild retractions, audible wheezing, pulse 100-120.    - SEVERE: Very SOB at rest, speaks in single words, struggling to breathe, sitting hunched forward, retractions, pulse > 120      Severe, sometimes SOB with talking. 5. RECURRENT SYMPTOM: "Have you had difficulty breathing before?" If so, ask: "When was the last time?" and "What happened that time?"     Ongoing for a year or so. 6. CARDIAC HISTORY: "Do you have any history of heart disease?" (e.g., heart attack, angina, bypass surgery, angioplasty)      htn 7. LUNG HISTORY: "Do you have any history  of lung disease?"  (e.g., pulmonary embolus, asthma, emphysema)    none 8. CAUSE: "What do you think is causing the breathing problem?"     unsure 9. OTHER SYMPTOMS: "Do you have any other symptoms? (e.g., dizziness, runny nose, cough, chest pain, fever)    Occasional lightheaded 10. PREGNANCY: "Is there any chance you are pregnant?" "When was your last menstrual period?"       na 11. TRAVEL: "Have you traveled out of the country in the last month?" (e.g., travel history, exposures)       no  Protocols used: BREATHING DIFFICULTY-A-AH

## 2019-12-24 NOTE — Telephone Encounter (Signed)
They made this an in person visit.  He said he would be getting tested tomorrow.  Does it need to be virtual?

## 2019-12-24 NOTE — Telephone Encounter (Signed)
Copied from [place] [id number]. Topic: General - Other >> Dec 24, 2019 11:18 AM Mcneil, Ja-Kwan wrote: Reason for CRM: Pt stated his inhaler use to cost $12 but now it costs him $45. Pt stated he needs a Rx for a cheaper inhaler.

## 2019-12-25 NOTE — Telephone Encounter (Signed)
Should be a virtual visit before any test results.

## 2019-12-25 NOTE — Telephone Encounter (Signed)
Patient advised. Appointment type changed to telephone visit.

## 2019-12-25 NOTE — Telephone Encounter (Signed)
Will assess during virtual visit tomorrow.

## 2019-12-26 ENCOUNTER — Encounter: Payer: Self-pay | Admitting: Family Medicine

## 2019-12-26 ENCOUNTER — Other Ambulatory Visit: Payer: Self-pay

## 2019-12-26 ENCOUNTER — Ambulatory Visit (INDEPENDENT_AMBULATORY_CARE_PROVIDER_SITE_OTHER): Payer: Self-pay | Admitting: Family Medicine

## 2019-12-26 VITALS — Temp 96.8°F | Wt 190.0 lb

## 2019-12-26 DIAGNOSIS — F17200 Nicotine dependence, unspecified, uncomplicated: Secondary | ICD-10-CM

## 2019-12-26 DIAGNOSIS — J4521 Mild intermittent asthma with (acute) exacerbation: Secondary | ICD-10-CM

## 2019-12-26 DIAGNOSIS — U071 COVID-19: Secondary | ICD-10-CM

## 2019-12-26 MED ORDER — ALBUTEROL SULFATE HFA 108 (90 BASE) MCG/ACT IN AERS: 2.0000 | INHALATION_SPRAY | Freq: Four times a day (QID) | RESPIRATORY_TRACT | 3 refills | Status: DC | PRN

## 2019-12-26 MED ORDER — PREDNISONE 5 MG PO TABS: ORAL_TABLET | ORAL | 0 refills | Status: DC

## 2019-12-26 NOTE — Progress Notes (Signed)
Patient: Derek [name] Male    DOB: 04/14/1966   54 y.o.   MRN: [id number] Visit Date: 12/26/2019  Today's Provider: Vernie Murders, PA   No chief complaint on file.  Subjective:    Virtual Visit via Telephone Note  I connected with Derek [name] on 12/26/19 at  1:20 PM EST by telephone and verified that I am speaking with the correct person using two identifiers.  Location: Patient: Home Provider: Office   I discussed the limitations, risks, security and privacy concerns of performing an evaluation and management service by telephone and the availability of in person appointments. I also discussed with the patient that there may be a patient responsible charge related to this service. The patient expressed understanding and agreed to proceed.  HPI: This 54 year old male smoker developed loss of taste, low grade fever, chills, chest discomfort and shortness of breath 6-7 days ago. Had a positive COVID test yesterday and remembers being around a co-worker, girlfriend and her children who have been positive, also. Ventolin Inhaler is helping to control SOB and no fever now.  Past Medical History:  Diagnosis Date  . Gout   . Hypertension    Past Surgical History:  Procedure Laterality Date  . CERVICAL DISC SURGERY    . COLONOSCOPY WITH PROPOFOL N/A 02/18/2018   Procedure: COLONOSCOPY WITH PROPOFOL;  Surgeon: Lin Landsman, MD;  Location: [facility] ENDOSCOPY;  Service: Gastroenterology;  Laterality: N/A;  . TONSILLECTOMY AND ADENOIDECTOMY     Family History  Problem Relation Age of Onset  . Fibromyalgia Mother   . Prostate cancer Father   . Hypertension Father   . Healthy Sister    No Known Allergies  Current Outpatient Medications:  .  allopurinol (ZYLOPRIM) 100 MG tablet, TAKE 2 TABLETS (200 MG TOTAL) BY MOUTH ONCE DAILY. (Patient not taking: Reported on 02/14/2019), Disp: 180 tablet, Rfl: 3 .  atenolol (TENORMIN) 50 MG tablet, Take 1 tablet (50 mg total) by mouth  daily., Disp: 90 tablet, Rfl: 3 .  beclomethasone (QVAR REDIHALER) 40 MCG/ACT inhaler, Inhale 2 puffs into the lungs 2 (two) times daily., Disp: 1 Inhaler, Rfl: 0 .  cephALEXin (KEFLEX) 500 MG capsule, Take 1 capsule (500 mg total) by mouth 4 (four) times daily., Disp: 30 capsule, Rfl: 0 .  citalopram (CELEXA) 20 MG tablet, TAKE 1 TABLET BY MOUTH EVERY DAY, Disp: 30 tablet, Rfl: 0 .  lisinopril (ZESTRIL) 20 MG tablet, TAKE 1 AND 1/2 TABLETS (30 MG TOTAL) BY MOUTH AT BEDTIME, Disp: 135 tablet, Rfl: 3 .  sildenafil (VIAGRA) 100 MG tablet, Take one tablet by mouth 1-4 hours prior to intercourse. Limit one tablet per day. (Patient not taking: Reported on 02/14/2019), Disp: 10 tablet, Rfl: 2 .  VENTOLIN HFA 108 (90 Base) MCG/ACT inhaler, TAKE 2 PUFFS BY MOUTH EVERY 6 HOURS AS NEEDED FOR WHEEZE OR SHORTNESS OF BREATH, Disp: 18 g, Rfl: 2  Review of Systems  Constitutional: Negative for appetite change, chills and fever.  Respiratory: Negative for chest tightness, shortness of breath and wheezing.   Cardiovascular: Negative for chest pain and palpitations.  Gastrointestinal: Negative for abdominal pain, nausea and vomiting.    Social History   Tobacco Use  . Smoking status: Former Smoker    Packs/day: 0.25    Types: Cigarettes    Quit date: 10/23/2018    Years since quitting: 1.1  . Smokeless tobacco: Never Used  . Tobacco comment: 1 pack every 2-3 days  Substance Use Topics  . Alcohol use: Yes    Comment: occasionally      Objective:   Temp (!) 96.8 F (36 C)   Wt 190 lb (86.2 kg)   BMI 28.06 kg/m   Physical Exam: Slight cough occasionally without wheezing or acute respiratory distress on telephonic interview.     Assessment & Plan     1. COVID-19 virus infection Has had shortness of breath with chills, chest discomfor over the past 6-7 days. Lost his sense of taste 3-4 days ago and had a positive COVID-19 test yesterday. Has worked closely with a coworker, been around his  girlfriend and her children, who were all positive for COVID-19, also. He is feeling a little better except shortness of breath persisting. No fever or sputum production. Will treat with prednisone taper, Albuterol inhaler, increase fluid intake and may use Mucinex-DM prn. Should be monitored and report progress in 5-7 days if no better. If symptoms worsen, may need to go to a Respiratory Clinic or ER for nebulizer treatment and consider need for infusion therapy. - MyChart COVID-19 home monitoring program - albuterol (VENTOLIN HFA) 108 (90 Base) MCG/ACT inhaler; Inhale 2 puffs into the lungs every 6 (six) hours as needed for wheezing or shortness of breath.  Dispense: 8 g; Refill: 3 - predniSONE (DELTASONE) 5 MG tablet; Taper down by 1 tablet daily dividing dosage among meals and bedtime (6 day 1, 5 day 2, 4 day 3, 3 day 4, 2 day 5 and 1 day 6) by mouth.  Dispense: 21 tablet; Refill: 0  2. Compulsive tobacco user syndrome Still smoking up to 1 ppd. Counseled regarding smoking cessation by tapering down and using nicotine patches as needed.  3. Mild intermittent reactive airway disease with wheezing with acute exacerbation Some recent wheeze and shortness of breath flared with positive COVID-19 test. Will treat with Prednisone taper and refill Proventil-HFA. Recheck if no better in 5-7 days. - albuterol (VENTOLIN HFA) 108 (90 Base) MCG/ACT inhaler; Inhale 2 puffs into the lungs every 6 (six) hours as needed for wheezing or shortness of breath.  Dispense: 8 g; Refill: 3 - predniSONE (DELTASONE) 5 MG tablet; Taper down by 1 tablet daily dividing dosage among meals and bedtime (6 day 1, 5 day 2, 4 day 3, 3 day 4, 2 day 5 and 1 day 6) by mouth.  Dispense: 21 tablet; Refill: 0   I discussed the assessment and treatment plan with the patient. The patient was provided an opportunity to ask questions and all were answered. The patient agreed with the plan and demonstrated an understanding of the  instructions.   The patient was advised to call back or seek an in-person evaluation if the symptoms worsen or if the condition fails to improve as anticipated.  I provided 16 minutes of non-face-to-face time during this encounter.    Vernie Murders, PA  [facility]

## 2019-12-31 ENCOUNTER — Other Ambulatory Visit: Payer: Self-pay | Admitting: Family Medicine

## 2019-12-31 DIAGNOSIS — F411 Generalized anxiety disorder: Secondary | ICD-10-CM

## 2019-12-31 NOTE — Telephone Encounter (Signed)
Requested medication (s) are due for refill today:   Yes  Requested medication (s) are on the active medication list:   Yes  Future visit scheduled:   No  LOV 5 days ago   Last ordered: 12/05/2019  #30  0 refills  Clinic note:   Pharmacy Dx code needed also requesting 90 day refill.      Requested Prescriptions  Pending Prescriptions Disp Refills   citalopram (CELEXA) 20 MG tablet [Pharmacy Med Name: CITALOPRAM HBR 20 MG TABLET] 90 tablet 1    Sig: TAKE 1 TABLET BY MOUTH EVERY DAY      Psychiatry:  Antidepressants - SSRI Passed - 12/31/2019  8:30 AM      Passed - Valid encounter within last 6 months    Recent Outpatient Visits           5 days ago COVID-19 virus infection   [facility], [place], [place]   9 months ago Abscess of scrotal wall   [facility], [place], [place]   9 months ago Mild persistent reactive airway disease without complication   [facility], [place], [place]   10 months ago Mild intermittent reactive airway disease with wheezing with acute exacerbation   [place], [place]   1 year ago Migraine without aura and without status migrainosus, not intractable   [place], [place]

## 2020-01-12 ENCOUNTER — Telehealth: Payer: Self-pay

## 2020-01-12 NOTE — Telephone Encounter (Signed)
Derek [name] is currently out of the office.

## 2020-02-19 ENCOUNTER — Ambulatory Visit: Payer: Self-pay

## 2020-02-19 NOTE — Telephone Encounter (Signed)
Patient having some intermittent dyspnea on exertion. Ventolin-HFA inhaler helps but doesn't last very long since COVID-19 infection 2 months ago. Still smoking 1 ppd and finding it difficult to quit. Denies fever or significant sputum production. Will add Trelegy 200 mcg 1 inhalation qd (samples), continue Ventolin-HFA prn and stop all smoking. He will call report of progress in 10-14 days.

## 2020-02-19 NOTE — Telephone Encounter (Signed)
Patient called stating that he continues to have moderate intermittent SOB. He states that he has been see by Vernie Murders PA about this issue and he is calling today to inform him his breathing has not gotten better per request last visit. Patient states that he has felt this way since having COVID-19. Patient is speaking in complete sentences and there is no audible wheezing. He states he has Hx of childhood asthma. He has no other symptoms. Patient is requesting call back from office for next step. Note will be routed per patient request.  Reason for Disposition . [1] MODERATE longstanding difficulty breathing (e.g., speaks in phrases, SOB even at rest, pulse 100-120) AND [2] SAME as normal  Answer Assessment - Initial Assessment Questions 1. RESPIRATORY STATUS: "Describe your breathing?" (e.g., wheezing, shortness of breath, unable to speak, severe coughing)      Sob  2. ONSET: "When did this breathing problem begin?"      COVID-19 and before 3. PATTERN "Does the difficult breathing come and go, or has it been constant since it started?"     On and off 4. SEVERITY: "How bad is your breathing?" (e.g., mild, moderate, severe)    - MILD: No SOB at rest, mild SOB with walking, speaks normally in sentences, can lay down, no retractions, pulse < 100.    - MODERATE: SOB at rest, SOB with minimal exertion and prefers to sit, cannot lie down flat, speaks in phrases, mild retractions, audible wheezing, pulse 100-120.    - SEVERE: Very SOB at rest, speaks in single words, struggling to breathe, sitting hunched forward, retractions, pulse > 120      moderate 5. RECURRENT SYMPTOM: "Have you had difficulty breathing before?" If so, ask: "When was the last time?" and "What happened that time?"     Yes in past had inhaler 6. CARDIAC HISTORY: "Do you have any history of heart disease?" (e.g., heart attack, angina, bypass surgery, angioplasty)   no 7. LUNG HISTORY: "Do you have any history of lung  disease?"  (e.g., pulmonary embolus, asthma, emphysema)     Asthma as child 8. CAUSE: "What do you think is causing the breathing problem?"      unsure 9. OTHER SYMPTOMS: "Do you have any other symptoms? (e.g., dizziness, runny nose, cough, chest pain, fever)    Runny nose 10. PREGNANCY: "Is there any chance you are pregnant?" "When was your last menstrual period?"       N/A 11. TRAVEL: "Have you traveled out of the country in the last month?" (e.g., travel history, exposures)      N/A  Protocols used: BREATHING DIFFICULTY-A-AH

## 2020-03-05 ENCOUNTER — Telehealth: Payer: Self-pay

## 2020-03-05 DIAGNOSIS — R0609 Other forms of dyspnea: Secondary | ICD-10-CM

## 2020-03-05 MED ORDER — TRELEGY ELLIPTA 100-62.5-25 MCG/INH IN AEPB: 1.0000 | INHALATION_SPRAY | Freq: Every day | RESPIRATORY_TRACT | 2 refills | Status: DC

## 2020-03-05 NOTE — Telephone Encounter (Signed)
Some improvement in shortness of breath with use of Trelegy and decreased smoking to 1/2 ppd now. Requesting refill of Trelegy and a pulmonology referral since he continues to have dyspnea on exertion and congestion. Mucinex-DM helps and had J&J COVID vaccination at work a month ago. Feels the COVID infection on 12-25-19 is improved without fever. Still having to use the Ventolin inhaler frequently.

## 2020-03-05 NOTE — Telephone Encounter (Signed)
Copied from [place] [id number]. Topic: General - Inquiry >> Mar 05, 2020  9:04 AM Scherrie Gerlach wrote: Reason for CRM: pt states Simona Huh told him to call end of week and he would call him back when he could today. Pt states Simona Huh wants to discuss pt's breathing with him.

## 2020-03-29 ENCOUNTER — Other Ambulatory Visit: Payer: Self-pay | Admitting: Family Medicine

## 2020-03-29 DIAGNOSIS — I10 Essential (primary) hypertension: Secondary | ICD-10-CM

## 2020-04-23 ENCOUNTER — Other Ambulatory Visit: Payer: Self-pay | Admitting: Family Medicine

## 2020-04-23 DIAGNOSIS — J4521 Mild intermittent asthma with (acute) exacerbation: Secondary | ICD-10-CM

## 2020-04-23 DIAGNOSIS — U071 COVID-19: Secondary | ICD-10-CM

## 2020-05-03 ENCOUNTER — Encounter: Payer: Self-pay | Admitting: Pulmonary Disease

## 2020-05-03 ENCOUNTER — Ambulatory Visit: Payer: BC Managed Care – PPO | Admitting: Pulmonary Disease

## 2020-05-03 ENCOUNTER — Other Ambulatory Visit: Payer: Self-pay

## 2020-05-03 VITALS — BP 132/78 | HR 83 | Temp 98.3°F | Ht 66.0 in | Wt 192.4 lb

## 2020-05-03 DIAGNOSIS — R0602 Shortness of breath: Secondary | ICD-10-CM

## 2020-05-03 DIAGNOSIS — F17210 Nicotine dependence, cigarettes, uncomplicated: Secondary | ICD-10-CM

## 2020-05-03 DIAGNOSIS — J449 Chronic obstructive pulmonary disease, unspecified: Secondary | ICD-10-CM

## 2020-05-03 DIAGNOSIS — Z8616 Personal history of COVID-19: Secondary | ICD-10-CM

## 2020-05-03 MED ORDER — TRELEGY ELLIPTA 100-62.5-25 MCG/INH IN AEPB: 1.0000 | INHALATION_SPRAY | Freq: Every day | RESPIRATORY_TRACT | 0 refills | Status: AC

## 2020-05-03 MED ORDER — ALBUTEROL SULFATE HFA 108 (90 BASE) MCG/ACT IN AERS: INHALATION_SPRAY | RESPIRATORY_TRACT | 2 refills | Status: DC

## 2020-05-03 MED ORDER — TRELEGY ELLIPTA 100-62.5-25 MCG/INH IN AEPB: 1.0000 | INHALATION_SPRAY | Freq: Every day | RESPIRATORY_TRACT | 11 refills | Status: DC

## 2020-05-03 NOTE — Patient Instructions (Addendum)
What we discussed today:  We are giving you a trial of Trelegy Ellipta 1 inhalation daily  You can get a coupon for Trelegy Ellipta at their website:https://www.trelegy.com/savings-and-coupons/  We also sending a rescue inhaler to use as needed, you may carry this inhaler with you and use as needed during the day up to 4 times a day  I recommend nicotine patches to quit smoking your website https://www.dixon-ward.com/  As we discussed I would take the patch off at bedtime but put a fresh patch on in the morning  You would need a 21 mg size patch as you smoke 1 pack a day  We are going to get breathing tests and a chest x-ray  We will see you in follow-up in 2 months time call sooner should any new difficulties arise

## 2020-05-03 NOTE — Progress Notes (Signed)
 Assessment & Plan:  1. COPD suggested by initial evaluation (Primary) Comments: It appears the patient may have asthma/COPD overlap syndrome Will evaluate with PFTs Resume Trelegy Albuterol  as needed - DG Chest 2 View; Future - Pulmonary Function Test [facility] Only; Future  2. Shortness of breath Comments: PFTs will be obtained Chest x-ray PA and lateral - DG Chest 2 View; Future  3. Tobacco dependence due to cigarettes Comments: Patient was counseled regards to discontinuation of smoking Different modalities of smoking cessation discussed Total counseling time 5 to 8 minutes  4. Personal history of covid-19 Comments: History of COVID-19 6 months ago Current on immunization for COVID-19   Patient Instructions  What we discussed today: We are giving you a trial of Trelegy Ellipta  1 inhalation daily You can get a coupon for Trelegy Ellipta  at their website:https://www.trelegy.com/savings-and-coupons/ We also sending a rescue inhaler to use as needed, you may carry this inhaler with you and use as needed during the day up to 4 times a day I recommend nicotine  patches to quit smoking your website [employer] As we discussed I would take the patch off at bedtime but put a fresh patch on in the morning You would need a 21 mg size patch as you smoke 1 pack a day We are going to get breathing tests and a chest x-ray We will see you in follow-up in 2 months time call sooner should any new difficulties arise  Please note: late entry documentation due to logistical difficulties during COVID-19 pandemic. This note is filed for information purposes only, and is not intended to be used for billing, nor does it represent the full scope/nature of the visit in question. Please see any associated scanned media linked to date of encounter for additional pertinent information.  Subjective:    HPI: Derek [name] is a 54 y.o. male presenting to the pulmonology clinic on  05/03/2020 with report of: PULMONARY CONSULT (per Dr. Philis reports of sob with exertion and at rest, prod cough with green mucus occ and wheezing x1y)     Outpatient Encounter Medications as of 05/03/2020  Medication Sig   [DISCONTINUED] albuterol  (VENTOLIN  HFA) 108 (90 Base) MCG/ACT inhaler TAKE 2 PUFFS BY MOUTH EVERY 6 HOURS AS NEEDED FOR WHEEZE OR SHORTNESS OF BREATH   [DISCONTINUED] albuterol  (VENTOLIN  HFA) 108 (90 Base) MCG/ACT inhaler TAKE 2 PUFFS BY MOUTH EVERY 6 HOURS AS NEEDED FOR WHEEZE OR SHORTNESS OF BREATH   [DISCONTINUED] allopurinol  (ZYLOPRIM ) 100 MG tablet TAKE 2 TABLETS (200 MG TOTAL) BY MOUTH ONCE DAILY.   [DISCONTINUED] atenolol  (TENORMIN ) 50 MG tablet Take 1 tablet (50 mg total) by mouth daily.   [DISCONTINUED] beclomethasone (QVAR REDIHALER) 40 MCG/ACT inhaler Inhale 2 puffs into the lungs 2 (two) times daily.   [DISCONTINUED] citalopram  (CELEXA ) 20 MG tablet TAKE 1 TABLET BY MOUTH EVERY DAY   [DISCONTINUED] Fluticasone -Umeclidin-Vilant (TRELEGY ELLIPTA ) 100-62.5-25 MCG/INH AEPB Inhale 1 puff into the lungs daily. Rinse mouth after use.   [DISCONTINUED] lisinopril  (ZESTRIL ) 20 MG tablet TAKE 1 AND 1/2 TABLETS (30 MG TOTAL) BY MOUTH AT BEDTIME   [DISCONTINUED] predniSONE  (DELTASONE ) 5 MG tablet Taper down by 1 tablet daily dividing dosage among meals and bedtime (6 day 1, 5 day 2, 4 day 3, 3 day 4, 2 day 5 and 1 day 6) by mouth.   [EXPIRED] Fluticasone -Umeclidin-Vilant (TRELEGY ELLIPTA ) 100-62.5-25 MCG/INH AEPB Inhale 1 puff into the lungs daily for 1 day.   [DISCONTINUED] Fluticasone -Umeclidin-Vilant (TRELEGY ELLIPTA ) 100-62.5-25 MCG/INH AEPB Inhale 1 puff  into the lungs daily.   No facility-administered encounter medications on file as of 05/03/2020.      Objective:   Vitals:   05/03/20 0928  BP: 132/78  Pulse: 83  Temp: 98.3 F (36.8 C)  Height: 5' 6 (1.676 m)  Weight: 192 lb 6.4 oz (87.3 kg)  SpO2: 98%  TempSrc: Temporal  BMI (Calculated): 31.07      Physical exam documentation is limited by delayed entry of information.

## 2020-05-07 ENCOUNTER — Telehealth: Payer: Self-pay

## 2020-05-07 NOTE — Telephone Encounter (Signed)
Pt is aware of date/time of covid test.   

## 2020-05-10 ENCOUNTER — Other Ambulatory Visit: Payer: Self-pay

## 2020-05-10 ENCOUNTER — Other Ambulatory Visit
Admission: RE | Admit: 2020-05-10 | Discharge: 2020-05-10 | Disposition: A | Discharge status: Home / Self Care | Payer: BC Managed Care – PPO | Source: Ambulatory Visit | Attending: Pulmonary Disease | Admitting: Pulmonary Disease

## 2020-05-10 DIAGNOSIS — Z20822 Contact with and (suspected) exposure to covid-19: Secondary | ICD-10-CM | POA: Diagnosis not present

## 2020-05-10 DIAGNOSIS — Z01812 Encounter for preprocedural laboratory examination: Secondary | ICD-10-CM | POA: Diagnosis not present

## 2020-05-10 LAB — SARS CORONAVIRUS 2 (TAT 6-24 HRS): SARS Coronavirus 2: NEGATIVE

## 2020-05-11 ENCOUNTER — Ambulatory Visit
Admission: RE | Admit: 2020-05-11 | Discharge: 2020-05-11 | Disposition: A | Discharge status: Home / Self Care | Payer: BC Managed Care – PPO | Source: Ambulatory Visit | Attending: Pulmonary Disease | Admitting: Pulmonary Disease

## 2020-05-11 ENCOUNTER — Ambulatory Visit
Admission: RE | Admit: 2020-05-11 | Discharge: 2020-05-11 | Disposition: A | Discharge status: Home / Self Care | Payer: BC Managed Care – PPO | Attending: Pulmonary Disease | Admitting: Pulmonary Disease

## 2020-05-11 ENCOUNTER — Ambulatory Visit (HOSPITAL_BASED_OUTPATIENT_CLINIC_OR_DEPARTMENT_OTHER): Payer: BC Managed Care – PPO

## 2020-05-11 DIAGNOSIS — J449 Chronic obstructive pulmonary disease, unspecified: Secondary | ICD-10-CM | POA: Diagnosis not present

## 2020-05-11 DIAGNOSIS — R0602 Shortness of breath: Secondary | ICD-10-CM | POA: Insufficient documentation

## 2020-05-11 MED ORDER — ALBUTEROL SULFATE (2.5 MG/3ML) 0.083% IN NEBU
2.5000 mg | INHALATION_SOLUTION | Freq: Once | RESPIRATORY_TRACT | Status: AC
  Administered 2020-05-11: 2.5 mg via RESPIRATORY_TRACT
  Filled 2020-05-11: qty 3

## 2020-06-21 ENCOUNTER — Telehealth: Payer: Self-pay | Admitting: Pulmonary Disease

## 2020-06-21 MED ORDER — TRELEGY ELLIPTA 100-62.5-25 MCG/INH IN AEPB: 1.0000 | INHALATION_SPRAY | Freq: Every day | RESPIRATORY_TRACT | 11 refills | Status: DC

## 2020-06-21 NOTE — Telephone Encounter (Signed)
Rx for trelegy was refilled  Spoke with the pt and notified that this was done  Nothing further needed

## 2020-06-23 ENCOUNTER — Other Ambulatory Visit: Payer: Self-pay | Admitting: Family Medicine

## 2020-06-23 DIAGNOSIS — I10 Essential (primary) hypertension: Secondary | ICD-10-CM

## 2020-06-23 NOTE — Telephone Encounter (Signed)
Requested  medications are  due for refill today yes  Requested medications are on the active medication list yes  Last refill 6/7  Last visit 02/2019  Future visit scheduled no  Notes to clinic Do not see addressed in visit since 02/2019 thus fails protocol of valid visit within 6 months

## 2020-07-07 ENCOUNTER — Ambulatory Visit: Payer: BC Managed Care – PPO | Admitting: Pulmonary Disease

## 2020-07-22 ENCOUNTER — Other Ambulatory Visit: Payer: Self-pay

## 2020-07-22 ENCOUNTER — Ambulatory Visit: Payer: Self-pay | Admitting: Nurse Practitioner

## 2020-07-22 VITALS — BP 149/105 | HR 74 | Temp 97.3°F | Resp 16 | Ht 69.0 in | Wt 193.0 lb

## 2020-07-22 DIAGNOSIS — M109 Gout, unspecified: Secondary | ICD-10-CM

## 2020-07-22 NOTE — Progress Notes (Signed)
   Subjective:    Patient ID: Derek [name], male    DOB: Jun 18, 1966, 54 y.o.   MRN: [id number]  HPI 54 year old patient with a history of GOUT. Has a prescription for allopurinol 100mg  twice daily from PCP. He had stopped taking it about 1-2 months ago and then had a flare that started about 2 weeks ago. He started taking the allopurinol since that time with some improvement but no resolution of acute symptoms.   He works at [employer] and is on his feet most of the day.   Has not had Uric Acid levels checked since 2018.   Symptoms onset with left foot ankle to left toe swelling with acute pain. Aside from allopurinol he has used ice for relief.   Is able to walk and perform job duties at this time.   He did not take his atenolol this morning.  Today's Vitals   07/22/20 1044  BP: (!) 149/105  Pulse: 74  Resp: 16  Temp: (!) 97.3 F (36.3 C)  TempSrc: Temporal  SpO2: 100%  Weight: 193 lb (87.5 kg)  Height: 5\' 9"  (1.753 m)   Body mass index is 28.5 kg/m. Review of Systems  Constitutional: Negative.   HENT: Negative.   Musculoskeletal: Positive for joint swelling.       Objective:   Physical Exam Constitutional:      Appearance: Normal appearance.  Musculoskeletal:       Feet:  Feet:     Comments: Swelling to proximal joint of left big toe/first digit. Able to palpate joint, able to move toe at joint, distal circulation and sensation intact. Pulses 2+ skin color WNL Neurological:     Mental Status: He is alert.     Orders Placed This Encounter  Procedures  . Uric acid        Assessment & Plan:  Increase daily dose of Allopurinol to 300mg  (will take 200mg  in the am and 100mg  in pm) will send off Uric Acid today to monitor therapeutic levels, patient to return to clinic if no improvement with increased dose, may continue to titrate as needed. Otherwise patient is instructed to continue 300mg  if stable until symptoms resolve and then may return to maintenance dosage of  200mg  daily.   RTC earlier with any worsening symptoms.

## 2020-07-23 LAB — URIC ACID: Uric Acid: 4.5 mg/dL (ref 3.8–8.4)

## 2020-08-23 ENCOUNTER — Other Ambulatory Visit: Payer: Self-pay | Admitting: Family Medicine

## 2020-08-23 ENCOUNTER — Other Ambulatory Visit: Payer: Self-pay | Admitting: Pulmonary Disease

## 2020-08-23 DIAGNOSIS — F411 Generalized anxiety disorder: Secondary | ICD-10-CM

## 2020-08-23 MED ORDER — CITALOPRAM HYDROBROMIDE 20 MG PO TABS: 20.0000 mg | ORAL_TABLET | Freq: Every day | ORAL | 0 refills | Status: DC

## 2020-08-23 MED ORDER — TRELEGY ELLIPTA 100-62.5-25 MCG/INH IN AEPB: 1.0000 | INHALATION_SPRAY | Freq: Every day | RESPIRATORY_TRACT | 11 refills | Status: DC

## 2020-08-23 NOTE — Telephone Encounter (Signed)
Copied from [place] [id number]. Topic: Quick Communication - Rx Refill/Question >> Aug 23, 2020  3:03 PM Leward Quan A wrote: Medication: citalopram (CELEXA) 20 MG tablet  Only have one pill left   Has the patient contacted their pharmacy? Yes.   (Agent: If no, request that the patient contact the pharmacy for the refill.) (Agent: If yes, when and what did the pharmacy advise?)  Preferred Pharmacy (with phone number or street name): [id number] [place], [place] - [address]  Phone:  [phone] Fax:  [phone]     Agent: Please be advised that RX refills may take up to 3 business days. We ask that you follow-up with your pharmacy.

## 2020-08-23 NOTE — Telephone Encounter (Signed)
..  lb

## 2020-08-23 NOTE — Telephone Encounter (Signed)
Rx for trelegy 100 has been sent to preferred pharmacy.  Patient is aware and voiced his understanding.  Nothing further needed.

## 2020-08-23 NOTE — Telephone Encounter (Signed)
LOV 12/26/19. TC to patient. Left VM to return call and schedule OV in order to continue refills. 30 day courtesy refill provided.

## 2020-08-23 NOTE — Telephone Encounter (Signed)
Previously addressed this request. Refusing duplicate.

## 2020-09-23 ENCOUNTER — Encounter: Payer: Self-pay | Admitting: Family Medicine

## 2020-09-23 ENCOUNTER — Other Ambulatory Visit: Payer: Self-pay

## 2020-09-23 ENCOUNTER — Telehealth (INDEPENDENT_AMBULATORY_CARE_PROVIDER_SITE_OTHER): Payer: BC Managed Care – PPO | Admitting: Family Medicine

## 2020-09-23 DIAGNOSIS — F411 Generalized anxiety disorder: Secondary | ICD-10-CM | POA: Diagnosis not present

## 2020-09-23 DIAGNOSIS — R0981 Nasal congestion: Secondary | ICD-10-CM

## 2020-09-23 DIAGNOSIS — N528 Other male erectile dysfunction: Secondary | ICD-10-CM | POA: Diagnosis not present

## 2020-09-23 DIAGNOSIS — I10 Essential (primary) hypertension: Secondary | ICD-10-CM

## 2020-09-23 MED ORDER — AMOXICILLIN 875 MG PO TABS: 875.0000 mg | ORAL_TABLET | Freq: Two times a day (BID) | ORAL | 0 refills | Status: DC

## 2020-09-23 MED ORDER — ATENOLOL 50 MG PO TABS: 50.0000 mg | ORAL_TABLET | Freq: Every day | ORAL | 3 refills | Status: DC

## 2020-09-23 MED ORDER — ALBUTEROL SULFATE HFA 108 (90 BASE) MCG/ACT IN AERS: INHALATION_SPRAY | RESPIRATORY_TRACT | 2 refills | Status: DC

## 2020-09-23 MED ORDER — SILDENAFIL CITRATE 100 MG PO TABS: 100.0000 mg | ORAL_TABLET | Freq: Every day | ORAL | 0 refills | Status: DC | PRN

## 2020-09-23 MED ORDER — CITALOPRAM HYDROBROMIDE 20 MG PO TABS: 20.0000 mg | ORAL_TABLET | Freq: Every day | ORAL | 3 refills | Status: DC

## 2020-09-23 NOTE — Progress Notes (Signed)
Virtual telephone visit    Virtual Visit via Telephone Note   This visit type was conducted due to national recommendations for restrictions regarding the COVID-19 Pandemic (e.g. social distancing) in an effort to limit this patient's exposure and mitigate transmission in our community. Due to his co-morbid illnesses, this patient is at least at moderate risk for complications without adequate follow up. This format is felt to be most appropriate for this patient at this time. The patient did not have access to video technology or had technical difficulties with video requiring transitioning to audio format only (telephone). Physical exam was limited to content and character of the telephone converstion.    Patient location: Home Provider location: Office  I discussed the limitations of evaluation and management by telemedicine and the availability of in person appointments. The patient expressed understanding and agreed to proceed.   Visit Date: 09/23/2020  Today's healthcare provider: Vernie Murders, PA   Chief Complaint  Patient presents with   Sinusitis   Subjective     HPI    HA, chills, facial pressure, SOB, sore throat, cough  onset week denies fever --also he needs refill for blood pressure    Last edited by Jeanelle Malling, CMA on 09/23/2020  3:49 PM. (History)       Past Medical History:  Diagnosis Date   Gout    Hypertension    Past Surgical History:  Procedure Laterality Date   CERVICAL DISC SURGERY     COLONOSCOPY WITH PROPOFOL N/A 02/18/2018   Procedure: COLONOSCOPY WITH PROPOFOL;  Surgeon: Lin Landsman, MD;  Location: [facility] ENDOSCOPY;  Service: Gastroenterology;  Laterality: N/A;   TONSILLECTOMY AND ADENOIDECTOMY     Social History   Tobacco Use   Smoking status: Current Every Day Smoker    Packs/day: 1.00    Years: 36.00    Pack years: 36.00    Types: Cigarettes   Smokeless tobacco: Current User   Tobacco comment: 1 pack every  2-3 days  Substance Use Topics   Alcohol use: Yes    Comment: occasionally   Drug use: No   Family History  Problem Relation Age of Onset   Fibromyalgia Mother    Prostate cancer Father    Hypertension Father    Healthy Sister    No Known Allergies    Medications: Outpatient Medications Prior to Visit  Medication Sig   albuterol (VENTOLIN HFA) 108 (90 Base) MCG/ACT inhaler TAKE 2 PUFFS BY MOUTH EVERY 6 HOURS AS NEEDED FOR WHEEZE OR SHORTNESS OF BREATH   allopurinol (ZYLOPRIM) 100 MG tablet TAKE 2 TABLETS (200 MG TOTAL) BY MOUTH ONCE DAILY.   atenolol (TENORMIN) 50 MG tablet Take 1 tablet (50 mg total) by mouth daily.   citalopram (CELEXA) 20 MG tablet Take 1 tablet (20 mg total) by mouth daily.   Fluticasone-Umeclidin-Vilant (TRELEGY ELLIPTA) 100-62.5-25 MCG/INH AEPB Inhale 1 puff into the lungs daily.   lisinopril (ZESTRIL) 20 MG tablet TAKE 1 AND 1/2 TABLETS (30 MG TOTAL) BY MOUTH AT BEDTIME   No facility-administered medications prior to visit.    Review of Systems  Constitutional: Positive for fatigue. Negative for fever.  HENT: Positive for congestion, postnasal drip, rhinorrhea, sinus pressure and sore throat.   Cardiovascular: Negative.   Gastrointestinal: Negative.   Musculoskeletal: Negative.       Objective    There were no vitals taken for this visit.  During telephonic interview, no acute respiratory distress noted.    Assessment & Plan  1. Head congestion Started having rhinorrhea, sore throat, PND, teeth aching, loss of taste with minimal cough. Continue Mucinex-DM and add Amoxil to Albuterol for wheeze and suspected sinusitis. He will get COVID test in the morning at the [facility] where he works. Must maintain COVID restrictions. Had the J&J COVID vaccination 4-5 months ago. - amoxicillin (AMOXIL) 875 MG tablet; Take 1 tablet (875 mg total) by mouth 2 (two) times daily.  Dispense: 20 tablet; Refill: 0 - albuterol (VENTOLIN HFA) 108 (90  Base) MCG/ACT inhaler; TAKE 2 PUFFS BY MOUTH EVERY 6 HOURS AS NEEDED FOR WHEEZE OR SHORTNESS OF BREATH  Dispense: 6.7 g; Refill: 2  2. Essential (primary) hypertension Diagnosis pulled to refill medication. - atenolol (TENORMIN) 50 MG tablet; Take 1 tablet (50 mg total) by mouth daily.  Dispense: 90 tablet; Refill: 3  3. Generalized anxiety disorder Out of this medications for several weeks. No panic attacks while taking the Celexa. Refill and advised to schedule follow up appointment. - citalopram (CELEXA) 20 MG tablet; Take 1 tablet (20 mg total) by mouth daily.  Dispense: 30 tablet; Refill: 3  4. Other male erectile dysfunction Ran out of this prescription. States it continues to work well in restoring erections. - sildenafil (VIAGRA) 100 MG tablet; Take 1 tablet (100 mg total) by mouth daily as needed for erectile dysfunction.  Dispense: 10 tablet; Refill: 0   No follow-ups on file.    I discussed the assessment and treatment plan with the patient. The patient was provided an opportunity to ask questions and all were answered. The patient agreed with the plan and demonstrated an understanding of the instructions.   The patient was advised to call back or seek an in-person evaluation if the symptoms worsen or if the condition fails to improve as anticipated.  I provided 30 minutes of non-face-to-face time during this encounter.  Andres Shad, PA, have reviewed all documentation for this visit. The documentation on 09/23/20 for the exam, diagnosis, procedures, and orders are all accurate and complete.   Vernie Murders, [place] [phone] (phone) [phone] (fax)  [place]

## 2020-09-23 NOTE — Progress Notes (Deleted)
    MyChart Video Visit    Virtual Visit via Video Note   This visit type was conducted due to national recommendations for restrictions regarding the COVID-19 Pandemic (e.g. social distancing) in an effort to limit this patient's exposure and mitigate transmission in our community. This patient is at least at moderate risk for complications without adequate follow up. This format is felt to be most appropriate for this patient at this time. Physical exam was limited by quality of the video and audio technology used for the visit.   Patient location: Home Provider location: phone  I discussed the limitations of evaluation and management by telemedicine and the availability of in person appointments. The patient expressed understanding and agreed to proceed.  Patient: Derek [name]   DOB: [birth year]   54 y.o. Male  MRN: [id number] Visit Date: 09/23/2020  Today's healthcare provider: Vernie Murders, PA   No chief complaint on file.  Subjective    HPI  ***  {Show patient history (optional):23778::" "}  Medications: Outpatient Medications Prior to Visit  Medication Sig  . albuterol (VENTOLIN HFA) 108 (90 Base) MCG/ACT inhaler TAKE 2 PUFFS BY MOUTH EVERY 6 HOURS AS NEEDED FOR WHEEZE OR SHORTNESS OF BREATH  . allopurinol (ZYLOPRIM) 100 MG tablet TAKE 2 TABLETS (200 MG TOTAL) BY MOUTH ONCE DAILY.  Marland Kitchen atenolol (TENORMIN) 50 MG tablet Take 1 tablet (50 mg total) by mouth daily.  . citalopram (CELEXA) 20 MG tablet Take 1 tablet (20 mg total) by mouth daily.  . Fluticasone-Umeclidin-Vilant (TRELEGY ELLIPTA) 100-62.5-25 MCG/INH AEPB Inhale 1 puff into the lungs daily.  Marland Kitchen lisinopril (ZESTRIL) 20 MG tablet TAKE 1 AND 1/2 TABLETS (30 MG TOTAL) BY MOUTH AT BEDTIME   No facility-administered medications prior to visit.    Review of Systems  {Heme  Chem  Endocrine  Serology  Results Review (optional):23779::" "}  Objective    There were no vitals taken for this visit. {Show previous  vital signs (optional):23777::" "}  Physical Exam     Assessment & Plan     ***  No follow-ups on file.     I discussed the assessment and treatment plan with the patient. The patient was provided an opportunity to ask questions and all were answered. The patient agreed with the plan and demonstrated an understanding of the instructions.   The patient was advised to call back or seek an in-person evaluation if the symptoms worsen or if the condition fails to improve as anticipated.  I provided *** minutes of non-face-to-face time during this encounter.  {provider attestation***:1}  Vernie Murders, [place] [phone] (phone) [phone] (fax)  [place]

## 2020-09-25 ENCOUNTER — Other Ambulatory Visit: Payer: Self-pay | Admitting: Family Medicine

## 2020-09-25 DIAGNOSIS — I10 Essential (primary) hypertension: Secondary | ICD-10-CM

## 2020-11-14 ENCOUNTER — Other Ambulatory Visit: Payer: Self-pay | Admitting: Family Medicine

## 2020-11-14 DIAGNOSIS — F411 Generalized anxiety disorder: Secondary | ICD-10-CM

## 2020-11-14 NOTE — Telephone Encounter (Signed)
Requested medication (s) are due for refill today:   No  Requested medication (s) are on the active medication list:   Yes  Future visit scheduled:   No   Last ordered: 09/23/2020 #30, 3 refills  Clinic note:  Pharmacy requesting 90 supplt and a DX Code.   Requested Prescriptions  Pending Prescriptions Disp Refills   citalopram (CELEXA) 20 MG tablet [Pharmacy Med Name: CITALOPRAM HBR 20 MG TABLET] 90 tablet 2    Sig: TAKE 1 TABLET BY MOUTH EVERY DAY      Psychiatry:  Antidepressants - SSRI Passed - 11/14/2020 12:30 PM      Passed - Valid encounter within last 6 months    Recent Outpatient Visits           1 month ago Head congestion   [place], PA-C   10 months ago COVID-19 virus infection   [facility], Vickki Muff, PA-C   1 year ago Abscess of scrotal wall   [facility], Vickki Muff, PA-C   1 year ago Mild persistent reactive airway disease without complication   [place], PA-C   1 year ago Mild intermittent reactive airway disease with wheezing with acute exacerbation   [facility], Vickki Muff, [place]

## 2020-11-15 ENCOUNTER — Telehealth: Payer: Self-pay | Admitting: Pulmonary Disease

## 2020-11-15 NOTE — Telephone Encounter (Signed)
co pay card has been placed up front for patient.  Patient is aware and voiced his understanding.  Nothing further needed.

## 2020-12-26 ENCOUNTER — Other Ambulatory Visit: Payer: Self-pay | Admitting: Family Medicine

## 2020-12-26 DIAGNOSIS — I10 Essential (primary) hypertension: Secondary | ICD-10-CM

## 2020-12-26 NOTE — Telephone Encounter (Signed)
Requested medications are due for refill today.  Yes  Requested medications are on the active medications list.  yes  Last refill. 09/25/2020  Future visit scheduled.   No  Notes to clinic.  Labs overdue -Has not been seen for HTN in over 1 year.

## 2021-02-08 ENCOUNTER — Ambulatory Visit: Payer: BC Managed Care – PPO | Admitting: Medical

## 2021-02-08 ENCOUNTER — Encounter: Payer: Self-pay | Admitting: Medical

## 2021-02-08 ENCOUNTER — Other Ambulatory Visit: Payer: Self-pay

## 2021-02-08 VITALS — BP 166/90 | HR 92 | Temp 98.1°F | Resp 16 | Wt 194.2 lb

## 2021-02-08 DIAGNOSIS — M109 Gout, unspecified: Secondary | ICD-10-CM

## 2021-02-08 MED ORDER — PREDNISONE 10 MG (21) PO TBPK: ORAL_TABLET | ORAL | 0 refills | Status: DC

## 2021-02-08 NOTE — Progress Notes (Signed)
  Subjective:    Patient ID: Derek [name], male    DOB: 16-Dec-1965, 55 y.o.   MRN: [id number]  HPI 55 yo male in non acute distress, gives permission for telemedicine appointment. History of Gout, seen on 07/12/20 for flare up at that time he had stopped his Allopurinal..   Called patient at  8:30 got VM, LM. Review of Systems     Objective:   Physical Exam        Assessment & Plan:

## 2021-02-08 NOTE — Patient Instructions (Signed)
Gout  Gout is painful swelling of your joints. Gout is a type of arthritis. It is caused by having too much uric acid in your body. Uric acid is a chemical that is made when your body breaks down substances called purines. If your body has too much uric acid, sharp crystals can form and build up in your joints. This causes pain and swelling. Gout attacks can happen quickly and be very painful (acute gout). Over time, the attacks can affect more joints and happen more often (chronic gout). What are the causes?  Too much uric acid in your blood. This can happen because: ? Your kidneys do not remove enough uric acid from your blood. ? Your body makes too much uric acid. ? You eat too many foods that are high in purines. These foods include organ meats, some seafood, and beer.  Trauma or stress. What increases the risk?  Having a family history of gout.  Being male and middle-aged.  Being male and having gone through menopause.  Being very overweight (obese).  Drinking alcohol, especially beer.  Not having enough water in the body (being dehydrated).  Losing weight too quickly.  Having an organ transplant.  Having lead poisoning.  Taking certain medicines.  Having kidney disease.  Having a skin condition called psoriasis. What are the signs or symptoms? An attack of acute gout usually happens in just one joint. The most common place is the big toe. Attacks often start at night. Other joints that may be affected include joints of the feet, ankle, knee, fingers, wrist, or elbow. Symptoms of an attack may include:  Very bad pain.  Warmth.  Swelling.  Stiffness.  Shiny, red, or purple skin.  Tenderness. The affected joint may be very painful to touch.  Chills and fever. Chronic gout may cause symptoms more often. More joints may be involved. You may also have white or yellow lumps (tophi) on your hands or feet or in other areas near your joints.   How is this  treated?  Treatment for this condition has two phases: treating an acute attack and preventing future attacks.  Acute gout treatment may include: ? NSAIDs. ? Steroids. These are taken by mouth or injected into a joint. ? Colchicine. This medicine relieves pain and swelling. It can be given by mouth or through an IV tube.  Preventive treatment may include: ? Taking small doses of NSAIDs or colchicine daily. ? Using a medicine that reduces uric acid levels in your blood. ? Making changes to your diet. You may need to see a food expert (dietitian) about what to eat and drink to prevent gout. Follow these instructions at home: During a gout attack  If told, put ice on the painful area: ? Put ice in a plastic bag. ? Place a towel between your skin and the bag. ? Leave the ice on for 20 minutes, 2-3 times a day.  Raise (elevate) the painful joint above the level of your heart as often as you can.  Rest the joint as much as possible. If the joint is in your leg, you may be given crutches.  Follow instructions from your doctor about what you cannot eat or drink.   Avoiding future gout attacks  Eat a low-purine diet. Avoid foods and drinks such as: ? Liver. ? Kidney. ? Anchovies. ? Asparagus. ? Herring. ? Mushrooms. ? Mussels. ? Beer.  Stay at a healthy weight. If you want to lose weight, talk with your doctor. Do   not lose weight too fast.  Start or continue an exercise plan as told by your doctor. Eating and drinking  Drink enough fluids to keep your pee (urine) pale yellow.  If you drink alcohol: ? Limit how much you use to:  0-1 drink a day for women.  0-2 drinks a day for men. ? Be aware of how much alcohol is in your drink. In the [place] one drink equals one 12 oz bottle of beer (355 mL), one 5 oz glass of wine (148 mL), or one 1 oz glass of hard liquor (44 mL). General instructions  Take over-the-counter and prescription medicines only as told by your doctor.  Do  not drive or use heavy machinery while taking prescription pain medicine.  Return to your normal activities as told by your doctor. Ask your doctor what activities are safe for you.  Keep all follow-up visits as told by your doctor. This is important. Contact a doctor if:  You have another gout attack.  You still have symptoms of a gout attack after 10 days of treatment.  You have problems (side effects) because of your medicines.  You have chills or a fever.  You have burning pain when you pee (urinate).  You have pain in your lower back or belly. Get help right away if:  You have very bad pain.  Your pain cannot be controlled.  You cannot pee. Summary  Gout is painful swelling of the joints.  The most common site of pain is the big toe, but it can affect other joints.  Medicines and avoiding some foods can help to prevent and treat gout attacks. This information is not intended to replace advice given to you by your health care provider. Make sure you discuss any questions you have with your health care provider. Document Revised: 05/01/2018 Document Reviewed: 05/01/2018 Elsevier Patient Education  2021 Elsevier Inc.  

## 2021-02-08 NOTE — Progress Notes (Signed)
   Subjective:    Patient ID: Derek [name], male    DOB: [birth year], 55 y.o.   MRN: [id number]  HPI 55 yo male in non acute distress, presents to day with painful Left foot at the 1st Metatarsal and swelling of his whole foot. He was seen in 9/31`/2021 with a gout flare up due to him stopping his Allopurinol. He was treated with a steroids and told to restart  His Allopurinol. Which he has been taking  Daily. He ate a "good amt" of shrimp last week before his gout flare up. Denies any beer ( holiday weekend). Smoker 1/2 ppd  Blood pressure (!) 166/90, pulse 92, temperature 98.1 F (36.7 C), temperature source Oral, resp. rate 16, weight 194 lb 3.2 oz (88.1 kg), SpO2 98 %.   Review of Systems  Constitutional: Negative for chills and fever.  Musculoskeletal: Positive for joint swelling.       Objective:   Physical Exam  Swelling , erythema, ttp at the 1st metatarsal. The entire left foot is swollen.      Assessment & Plan:  Gout left great toe Elevate foot at  45 degree angle Offer crutches he declines, offered pain medication , he declines. Meds ordered this encounter  Medications  . predniSONE (STERAPRED UNI-PAK 21 TAB) 10 MG (21) TBPK tablet    Sig: Take 6 tablets by mouth today then 5 tablets tomorrow, then one tablets less each day there after. Take with food.    Dispense:  21 tablet    Refill:  0  Avoid shellfish in the future. Patient verbalizes understanding , given AVS with gout information. And has no questions at discharge. Given work note he may return on 02/14/21 if he has improved. Encouraged patient to take BP medication earlier in the morning to keep better control.

## 2021-02-26 ENCOUNTER — Other Ambulatory Visit: Payer: Self-pay | Admitting: Pulmonary Disease

## 2021-02-26 DIAGNOSIS — R0981 Nasal congestion: Secondary | ICD-10-CM

## 2021-03-23 ENCOUNTER — Ambulatory Visit: Payer: BC Managed Care – PPO | Admitting: Medical

## 2021-03-23 ENCOUNTER — Other Ambulatory Visit: Payer: Self-pay

## 2021-03-23 VITALS — BP 132/88 | HR 67 | Temp 98.1°F | Resp 16 | Wt 194.2 lb

## 2021-03-23 DIAGNOSIS — S65511A Laceration of blood vessel of left index finger, initial encounter: Secondary | ICD-10-CM

## 2021-03-23 MED ORDER — AMOXICILLIN-POT CLAVULANATE 875-125 MG PO TABS: 1.0000 | ORAL_TABLET | Freq: Two times a day (BID) | ORAL | 0 refills | Status: DC

## 2021-03-23 NOTE — Patient Instructions (Signed)
Wound Care, Adult Taking care of your wound properly can help to prevent pain, infection, and scarring. It can also help your wound heal more quickly. Follow instructions from your health care provider about how to care for your wound. Supplies needed:  Soap and water.  Wound cleanser.  Gauze.  If needed, a clean bandage (dressing) or other type of wound dressing material to cover or place in the wound. Follow your health care provider's instructions about what dressing supplies to use.  Cream or ointment to apply to the wound, if told by your health care provider. How to care for your wound Cleaning the wound Ask your health care provider how to clean the wound. This may include:  Using mild soap and water or a wound cleanser.  Using a clean gauze to pat the wound dry after cleaning it. Do not rub or scrub the wound. Dressing care  Wash your hands with soap and water for at least 20 seconds before and after you change the dressing. If soap and water are not available, use hand sanitizer.  Change your dressing as told by your health care provider. This may include: ? Cleaning or rinsing out (irrigating) the wound. ? Placing a dressing over the wound or in the wound (packing). ? Covering the wound with an outer dressing.  Leave any stitches (sutures), skin glue, or adhesive strips in place. These skin closures may need to stay in place for 2 weeks or longer. If adhesive strip edges start to loosen and curl up, you may trim the loose edges. Do not remove adhesive strips completely unless your health care provider tells you to do that.  Ask your health care provider when you can leave the wound uncovered. Checking for infection Check your wound area every day for signs of infection. Check for:  More redness, swelling, or pain.  Fluid or blood.  Warmth.  Pus or a bad smell.   Follow these instructions at home Medicines  If you were prescribed an antibiotic medicine, cream, or  ointment, take or apply it as told by your health care provider. Do not stop using the antibiotic even if your condition improves.  If you were prescribed pain medicine, take it 30 minutes before you do any wound care or as told by your health care provider.  Take over-the-counter and prescription medicines only as told by your health care provider. Eating and drinking  Eat a diet that includes protein, vitamin A, vitamin C, and other nutrient-rich foods to help the wound heal. ? Foods rich in protein include meat, fish, eggs, dairy, beans, and nuts. ? Foods rich in vitamin A include carrots and dark green, leafy vegetables. ? Foods rich in vitamin C include citrus fruits, tomatoes, broccoli, and peppers.  Drink enough fluid to keep your urine pale yellow. General instructions  Do not take baths, swim, use a hot tub, or do anything that would put the wound underwater until your health care provider approves. Ask your health care provider if you may take showers. You may only be allowed to take sponge baths.  Do not scratch or pick at the wound. Keep it covered as told by your health care provider.  Return to your normal activities as told by your health care provider. Ask your health care provider what activities are safe for you.  Protect your wound from the sun when you are outside for the first 6 months, or for as long as told by your health care provider. Cover   up the scar area or apply sunscreen that has an SPF of at least 30.  Do not use any products that contain nicotine or tobacco, such as cigarettes, e-cigarettes, and chewing tobacco. These may delay wound healing. If you need help quitting, ask your health care provider.  Keep all follow-up visits as told by your health care provider. This is important. Contact a health care provider if:  You received a tetanus shot and you have swelling, severe pain, redness, or bleeding at the injection site.  Your pain is not controlled  with medicine.  You have any of these signs of infection: ? More redness, swelling, or pain around the wound. ? Fluid or blood coming from the wound. ? Warmth coming from the wound. ? Pus or a bad smell coming from the wound. ? A fever or chills.  You are nauseous or you vomit.  You are dizzy. Get help right away if:  You have a red streak of skin near the area around your wound.  Your wound has been closed with staples, sutures, skin glue, or adhesive strips and it begins to open up and separate.  Your wound is bleeding, and the bleeding does not stop with gentle pressure.  You have a rash.  You faint.  You have trouble breathing. These symptoms may represent a serious problem that is an emergency. Do not wait to see if the symptoms will go away. Get medical help right away. Call your local emergency services (911 in the [place] Do not drive yourself to the hospital. Summary  Always wash your hands with soap and water for at least 20 seconds before and after changing your dressing.  Change your dressing as told by your health care provider.  To help with healing, eat foods that are rich in protein, vitamin A, vitamin C, and other nutrients.  Check your wound every day for signs of infection. Contact your health care provider if you suspect that your wound is infected. This information is not intended to replace advice given to you by your health care provider. Make sure you discuss any questions you have with your health care provider. Document Revised: 07/25/2019 Document Reviewed: 07/25/2019 Elsevier Patient Education  2021 Elsevier Inc.  

## 2021-03-23 NOTE — Progress Notes (Signed)
Patient ID: Derek [name], male   DOB: October 16, 1966, 56 y.o.   MRN: [id number]  Subjective:    Subjective   Patient ID: Derek [name], male    DOB: 1966-07-14, 55 y.o.   MRN: [id number]  HPI  55 yo male in non acute distress. On Saturday morning trimming bushes with an electric trimmer and cut distal tip of 4th finger left hand.. Did not seek medical care No numbness or tingling, good range of motion. Denies fever or chills, discharge.  Tdap  2019. Painful to use the finger and if he hits it. Cleaning with Hydorgen peroxide and using an antiseptic bandage..    No Known Allergies   Blood pressure 132/88, pulse 67, temperature 98.1 F (36.7 C), temperature source Oral, resp. rate 16, weight 194 lb 3.2 oz (88.1 kg), SpO2 98 %.     Review of Systems No numbness or tingling, no problems moving finger distally or proximally.   patient is right handed. Objective:   Objective   Physical Exam Vitals and nursing note reviewed.  Constitutional:      Appearance: Normal appearance.  Eyes:     Extraocular Movements: Extraocular movements intact.     Conjunctiva/sclera: Conjunctivae normal.     Pupils: Pupils are equal, round, and reactive to light.  Pulmonary:     Effort: Pulmonary effort is normal.  Musculoskeletal:        General: Swelling, tenderness and signs of injury present. Normal range of motion.  Skin:    General: Skin is warm and dry.     Findings: Erythema (mild) present.  Neurological:     Mental Status: He is alert.       Medial side of  4th finger  Left hand, did not cut nail or nail bed. No discharge noted. Mild erythema compared to right 4th finger.    Assessment & Plan:  Laceration left tip of  4th finger.  Offer x-ray but patient declined at this time.He agrees it is tender distally but does not think he injuryed the bone. Given work note with restrictions. He did not want to be taken out of work.     Meds ordered this encounter   Medications  . amoxicillin-clavulanate (AUGMENTIN) 875-125 MG tablet    Sig: Take 1 tablet by mouth 2 (two) times daily. Take with food    Dispense:  20 tablet    Refill:  0  Reviewed cleaning and bandaging the wound, s/s of infection. Washed/cleaned in clinic and triple antibiotic ointment and  Padded dressing applied by M. Wilson Therapist, sports. Reviewed side effects of medication and allergic reaction s/s. He is to seek medical care if any occur. Return on Monday for recheck , sooner if any concerns. Patient verbalizes understanding and has no questions at discharge.

## 2021-03-28 ENCOUNTER — Other Ambulatory Visit: Payer: Self-pay

## 2021-03-28 ENCOUNTER — Other Ambulatory Visit: Payer: Self-pay | Admitting: Physician Assistant

## 2021-03-28 ENCOUNTER — Ambulatory Visit: Payer: BC Managed Care – PPO | Admitting: Medical

## 2021-03-28 VITALS — BP 132/86 | HR 67 | Temp 97.9°F | Resp 16

## 2021-03-28 DIAGNOSIS — I10 Essential (primary) hypertension: Secondary | ICD-10-CM

## 2021-03-28 DIAGNOSIS — S61215D Laceration without foreign body of left ring finger without damage to nail, subsequent encounter: Secondary | ICD-10-CM

## 2021-03-28 NOTE — Patient Instructions (Signed)
Wound Care, Adult Taking care of your wound properly can help to prevent pain, infection, and scarring. It can also help your wound heal more quickly. Follow instructions from your health care provider about how to care for your wound. Supplies needed:  Soap and water.  Wound cleanser.  Gauze.  If needed, a clean bandage (dressing) or other type of wound dressing material to cover or place in the wound. Follow your health care provider's instructions about what dressing supplies to use.  Cream or ointment to apply to the wound, if told by your health care provider. How to care for your wound Cleaning the wound Ask your health care provider how to clean the wound. This may include:  Using mild soap and water or a wound cleanser.  Using a clean gauze to pat the wound dry after cleaning it. Do not rub or scrub the wound. Dressing care  Wash your hands with soap and water for at least 20 seconds before and after you change the dressing. If soap and water are not available, use hand sanitizer.  Change your dressing as told by your health care provider. This may include: ? Cleaning or rinsing out (irrigating) the wound. ? Placing a dressing over the wound or in the wound (packing). ? Covering the wound with an outer dressing.  Leave any stitches (sutures), skin glue, or adhesive strips in place. These skin closures may need to stay in place for 2 weeks or longer. If adhesive strip edges start to loosen and curl up, you may trim the loose edges. Do not remove adhesive strips completely unless your health care provider tells you to do that.  Ask your health care provider when you can leave the wound uncovered. Checking for infection Check your wound area every day for signs of infection. Check for:  More redness, swelling, or pain.  Fluid or blood.  Warmth.  Pus or a bad smell.   Follow these instructions at home Medicines  If you were prescribed an antibiotic medicine, cream, or  ointment, take or apply it as told by your health care provider. Do not stop using the antibiotic even if your condition improves.  If you were prescribed pain medicine, take it 30 minutes before you do any wound care or as told by your health care provider.  Take over-the-counter and prescription medicines only as told by your health care provider. Eating and drinking  Eat a diet that includes protein, vitamin A, vitamin C, and other nutrient-rich foods to help the wound heal. ? Foods rich in protein include meat, fish, eggs, dairy, beans, and nuts. ? Foods rich in vitamin A include carrots and dark green, leafy vegetables. ? Foods rich in vitamin C include citrus fruits, tomatoes, broccoli, and peppers.  Drink enough fluid to keep your urine pale yellow. General instructions  Do not take baths, swim, use a hot tub, or do anything that would put the wound underwater until your health care provider approves. Ask your health care provider if you may take showers. You may only be allowed to take sponge baths.  Do not scratch or pick at the wound. Keep it covered as told by your health care provider.  Return to your normal activities as told by your health care provider. Ask your health care provider what activities are safe for you.  Protect your wound from the sun when you are outside for the first 6 months, or for as long as told by your health care provider. Cover   up the scar area or apply sunscreen that has an SPF of at least 30.  Do not use any products that contain nicotine or tobacco, such as cigarettes, e-cigarettes, and chewing tobacco. These may delay wound healing. If you need help quitting, ask your health care provider.  Keep all follow-up visits as told by your health care provider. This is important. Contact a health care provider if:  You received a tetanus shot and you have swelling, severe pain, redness, or bleeding at the injection site.  Your pain is not controlled  with medicine.  You have any of these signs of infection: ? More redness, swelling, or pain around the wound. ? Fluid or blood coming from the wound. ? Warmth coming from the wound. ? Pus or a bad smell coming from the wound. ? A fever or chills.  You are nauseous or you vomit.  You are dizzy. Get help right away if:  You have a red streak of skin near the area around your wound.  Your wound has been closed with staples, sutures, skin glue, or adhesive strips and it begins to open up and separate.  Your wound is bleeding, and the bleeding does not stop with gentle pressure.  You have a rash.  You faint.  You have trouble breathing. These symptoms may represent a serious problem that is an emergency. Do not wait to see if the symptoms will go away. Get medical help right away. Call your local emergency services (911 in the [place] Do not drive yourself to the hospital. Summary  Always wash your hands with soap and water for at least 20 seconds before and after changing your dressing.  Change your dressing as told by your health care provider.  To help with healing, eat foods that are rich in protein, vitamin A, vitamin C, and other nutrients.  Check your wound every day for signs of infection. Contact your health care provider if you suspect that your wound is infected. This information is not intended to replace advice given to you by your health care provider. Make sure you discuss any questions you have with your health care provider. Document Revised: 07/25/2019 Document Reviewed: 07/25/2019 Elsevier Patient Education  2021 Elsevier Inc.  

## 2021-03-28 NOTE — Progress Notes (Signed)
   Subjective:    Patient ID: Derek [name], male    DOB: 1966/03/09, 55 y.o.   MRN: [id number]  HPI  55 yo male in non acute distress. Patient returns for recheck of   4th left finger distal laceration. He states it still hurts when he hits it ( he only has a bandage on the tip of the finger for this visit). He is taking his Augmentin as prescribed.   He did not seek medical care until 3 days after injury.  He cut with an electric trimmer,trimming bushes.  Blood pressure 132/86, pulse 67, temperature 97.9 F (36.6 C), temperature source Oral, resp. rate 16, SpO2 98 %.  No Known Allergies  Review of Systems  Constitutional: Negative for chills and fever.  Skin: Positive for wound. Negative for color change, pallor and rash.  No discharge from wound.     Objective:   Physical Exam   Healing but slow due to the depth of the laceration. Located medial side of finger. Tender at site of laceration. Distal finger with  2s CR distally and full range of motion.     Assessment & Plan:  Laceration to left 4th finger distally smoker Finish all of the  antibiotics. Clean twice daily with dilute soap and water, triple antibiotic ointment to the site and a padded dressing. Offerred xray a second time, he says it only hurts when he hits it. So he declines x-ray again  at this time. He states if it still hurts next week he will get an x-ray. I told him there is concern for  Infection in the bone. He still does not want an x-ray. " maybe next week". Wound care reviewed by me and the Nurse M. Wilson.The finger was cleaned with  50/50 Saline/H2O2, triple antibiotic ointment to the site. A padded dressing was applied and recommended when he is out and about using finger. He is  To return in one week for a a recheck. Sooner if any concerns. He verbalizes understanding and has no questions at discharge.

## 2021-04-01 NOTE — Telephone Encounter (Signed)
Pts wife called in stating the pharmacy has still not received the medication refill, pt wife states the pt has now completely ran out of the medication, and request a call back. Please advise

## 2021-04-04 ENCOUNTER — Ambulatory Visit: Payer: BC Managed Care – PPO | Admitting: Medical

## 2021-04-23 ENCOUNTER — Other Ambulatory Visit: Payer: Self-pay | Admitting: Family Medicine

## 2021-04-23 DIAGNOSIS — I10 Essential (primary) hypertension: Secondary | ICD-10-CM

## 2021-04-23 NOTE — Telephone Encounter (Signed)
Requested Prescriptions  Pending Prescriptions Disp Refills  . lisinopril (ZESTRIL) 20 MG tablet [Pharmacy Med Name: LISINOPRIL 20 MG TABLET] 26 tablet 0    Sig: TAKE 1 AND 1/2 TABLETS (30 MG TOTAL) BY MOUTH AT BEDTIME--MAKE APPT FOR MORE REFILLS     Cardiovascular:  ACE Inhibitors Failed - 04/23/2021  1:30 PM      Failed - Cr in normal range and within 180 days    Creatinine, Ser  Date Value Ref Range Status  02/14/2019 1.00 0.76 - 1.27 mg/dL Final         Failed - K in normal range and within 180 days    Potassium  Date Value Ref Range Status  02/14/2019 4.6 3.5 - 5.2 mmol/L Final         Failed - Valid encounter within last 6 months    Recent Outpatient Visits          7 months ago Head congestion   [place], Vickki Muff, PA-C   1 year ago COVID-19 virus infection   [facility], Vickki Muff, PA-C   2 years ago Abscess of scrotal wall   [facility], Vickki Muff, PA-C   2 years ago Mild persistent reactive airway disease without complication   [facility], Vickki Muff, PA-C   2 years ago Mild intermittent reactive airway disease with wheezing with acute exacerbation   [facility], Vickki Muff, PA-C      Future Appointments            In 2 weeks Chrismon, Vickki Muff, PA-C [employer], [place] - Patient is not pregnant      Passed - Last BP in normal range    BP Readings from Last 1 Encounters:  03/28/21 132/86

## 2021-05-10 ENCOUNTER — Ambulatory Visit: Payer: Self-pay | Admitting: Family Medicine

## 2021-05-12 ENCOUNTER — Other Ambulatory Visit: Payer: Self-pay | Admitting: Family Medicine

## 2021-05-12 DIAGNOSIS — I10 Essential (primary) hypertension: Secondary | ICD-10-CM

## 2021-05-12 NOTE — Telephone Encounter (Signed)
   Notes to clinic:  Patient has appt on 05/16/2021 Review for another refill    Requested Prescriptions  Pending Prescriptions Disp Refills   lisinopril (ZESTRIL) 20 MG tablet [Pharmacy Med Name: LISINOPRIL 20 MG TABLET] 26 tablet 0    Sig: TAKE 1 AND 1/2 TABLETS (30 MG TOTAL) BY MOUTH AT BEDTIME--MAKE APPT FOR MORE REFILLS      Cardiovascular:  ACE Inhibitors Failed - 05/12/2021 10:24 AM      Failed - Cr in normal range and within 180 days    Creatinine, Ser  Date Value Ref Range Status  02/14/2019 1.00 0.76 - 1.27 mg/dL Final          Failed - K in normal range and within 180 days    Potassium  Date Value Ref Range Status  02/14/2019 4.6 3.5 - 5.2 mmol/L Final          Failed - Valid encounter within last 6 months    Recent Outpatient Visits           7 months ago Head congestion   [place], Vickki Muff, PA-C   1 year ago COVID-19 virus infection   [facility], Vickki Muff, PA-C   2 years ago Abscess of scrotal wall   [facility], Vickki Muff, PA-C   2 years ago Mild persistent reactive airway disease without complication   [facility], Vickki Muff, PA-C   2 years ago Mild intermittent reactive airway disease with wheezing with acute exacerbation   [facility], Vickki Muff, PA-C       Future Appointments             In 4 days Fisher, Kirstie Peri, MD [facility], [place] - Patient is not pregnant      Passed - Last BP in normal range    BP Readings from Last 1 Encounters:  03/28/21 132/86

## 2021-05-16 ENCOUNTER — Ambulatory Visit: Payer: BC Managed Care – PPO | Admitting: Family Medicine

## 2021-05-16 ENCOUNTER — Other Ambulatory Visit: Payer: Self-pay

## 2021-05-16 ENCOUNTER — Encounter: Payer: Self-pay | Admitting: Family Medicine

## 2021-05-16 VITALS — BP 150/100 | HR 62 | Temp 98.6°F | Resp 18 | Wt 198.0 lb

## 2021-05-16 DIAGNOSIS — F411 Generalized anxiety disorder: Secondary | ICD-10-CM | POA: Diagnosis not present

## 2021-05-16 DIAGNOSIS — I10 Essential (primary) hypertension: Secondary | ICD-10-CM

## 2021-05-16 DIAGNOSIS — E538 Deficiency of other specified B group vitamins: Secondary | ICD-10-CM

## 2021-05-16 DIAGNOSIS — E790 Hyperuricemia without signs of inflammatory arthritis and tophaceous disease: Secondary | ICD-10-CM | POA: Diagnosis not present

## 2021-05-16 DIAGNOSIS — Z125 Encounter for screening for malignant neoplasm of prostate: Secondary | ICD-10-CM

## 2021-05-16 DIAGNOSIS — J45909 Unspecified asthma, uncomplicated: Secondary | ICD-10-CM | POA: Insufficient documentation

## 2021-05-16 DIAGNOSIS — Z1159 Encounter for screening for other viral diseases: Secondary | ICD-10-CM | POA: Diagnosis not present

## 2021-05-16 MED ORDER — CITALOPRAM HYDROBROMIDE 20 MG PO TABS: 40.0000 mg | ORAL_TABLET | Freq: Every day | ORAL | Status: DC

## 2021-05-16 MED ORDER — LISINOPRIL-HYDROCHLOROTHIAZIDE 20-12.5 MG PO TABS
ORAL_TABLET | ORAL | 0 refills | Status: DC
Start: 2021-05-16 — End: 2021-08-15

## 2021-05-16 NOTE — Progress Notes (Signed)
Established patient visit   Patient: Derek [name]   DOB: 31-Jan-1966   55 y.o. Male  MRN: [id number] Visit Date: 05/16/2021  Today's healthcare provider: Lelon Huh, MD   Chief Complaint  Patient presents with   Hypertension   Anxiety   Subjective    HPI  Hypertension, follow-up  BP Readings from Last 3 Encounters:  05/16/21 (!) 150/100  03/28/21 132/86  03/23/21 132/88   Wt Readings from Last 3 Encounters:  05/16/21 198 lb (89.8 kg)  03/23/21 194 lb 3.2 oz (88.1 kg)  02/08/21 194 lb 3.2 oz (88.1 kg)     He was last seen for hypertension 7 months ago via virtual visit (seen by [employer], PA-C).    Management since that visit includes continuing same medication.   He reports good compliance with treatment. He is not having side effects.  He is following a Regular diet. He is not exercising. He does smoke.  Use of agents associated with hypertension: none.   Outside blood pressures are not checked. Symptoms: No chest pain No chest pressure  No palpitations No syncope  Yes dyspnea No orthopnea  No paroxysmal nocturnal dyspnea No lower extremity edema   Pertinent labs: No results found for: CHOL, HDL, LDLCALC, LDLDIRECT, TRIG, CHOLHDL Lab Results  Component Value Date   NA 142 02/14/2019   K 4.6 02/14/2019   CREATININE 1.00 02/14/2019   GFRNONAA 86 02/14/2019   GFRAA 100 02/14/2019   GLUCOSE 95 02/14/2019     The ASCVD Risk score Mikey Bussing DC Jr., et al., 2013) failed to calculate for the following reasons:   Cannot find a previous HDL lab   Cannot find a previous total cholesterol lab   ---------------------------------------------------------------------------------------------------   Anxiety, Follow-up  He was last seen for anxiety 7 months ago. Changes made at last visit include none; continue same medication.   He reports good compliance with treatment. He reports good tolerance of treatment. He is not having side effects.   He  feels his anxiety is moderate and Unchanged since last visit.  Symptoms: No chest pain No difficulty concentrating  No dizziness No fatigue  No feelings of losing control No insomnia  No irritable No palpitations  No panic attacks No racing thoughts  No shortness of breath No sweating  No tremors/shakes    GAD-7 Results GAD-7 Generalized Anxiety Disorder Screening Tool 09/23/2020  1. Feeling Nervous, Anxious, or on Edge 2  2. Not Being Able to Stop or Control Worrying 0  3. Worrying Too Much About Different Things 1  4. Trouble Relaxing 0  5. Being So Restless it's Hard To Sit Still 0  6. Becoming Easily Annoyed or Irritable 1  7. Feeling Afraid As If Something Awful Might Happen 1  Total GAD-7 Score 5  Difficulty At Work, Home, or Getting  Along With Others? Not difficult at all    PHQ-9 Scores PHQ9 SCORE ONLY 09/23/2020 01/08/2018  PHQ-9 Total Score 0 1    ---------------------------------------------------------------------------------------------------      Medications: Outpatient Medications Prior to Visit  Medication Sig   albuterol (VENTOLIN HFA) 108 (90 Base) MCG/ACT inhaler TAKE 2 PUFFS BY MOUTH EVERY 6 HOURS AS NEEDED FOR WHEEZE OR SHORTNESS OF BREATH   allopurinol (ZYLOPRIM) 100 MG tablet TAKE 2 TABLETS (200 MG TOTAL) BY MOUTH ONCE DAILY.   atenolol (TENORMIN) 50 MG tablet Take 1 tablet (50 mg total) by mouth daily.   citalopram (CELEXA) 20 MG tablet TAKE 1 TABLET BY  MOUTH EVERY DAY   Fluticasone-Umeclidin-Vilant (TRELEGY ELLIPTA) 100-62.5-25 MCG/INH AEPB Inhale 1 puff into the lungs daily.   lisinopril (ZESTRIL) 20 MG tablet TAKE 1 AND 1/2 TABLETS (30 MG TOTAL) BY MOUTH AT BEDTIME--MAKE APPT FOR MORE REFILLS   sildenafil (VIAGRA) 100 MG tablet Take 1 tablet (100 mg total) by mouth daily as needed for erectile dysfunction.   [DISCONTINUED] amoxicillin-clavulanate (AUGMENTIN) 875-125 MG tablet Take 1 tablet by mouth 2 (two) times daily. Take with food (Patient not  taking: Reported on 05/16/2021)   [DISCONTINUED] predniSONE (STERAPRED UNI-PAK 21 TAB) 10 MG (21) TBPK tablet Take 6 tablets by mouth today then 5 tablets tomorrow, then one tablets less each day there after. Take with food. (Patient not taking: Reported on 05/16/2021)   No facility-administered medications prior to visit.    Review of Systems  Constitutional:  Negative for appetite change, chills and fever.  Respiratory:  Negative for chest tightness, shortness of breath and wheezing.   Cardiovascular:  Negative for chest pain and palpitations.  Gastrointestinal:  Negative for abdominal pain, nausea and vomiting.      Objective    BP (!) 150/100 (BP Location: Right Arm, Patient Position: Sitting, Cuff Size: Normal)   Pulse 62   Temp 98.6 F (37 C) (Temporal)   Resp 18   Wt 198 lb (89.8 kg)   SpO2 97% Comment: room air  BMI 29.24 kg/m   Today's Vitals   05/16/21 0859 05/16/21 0904  BP: (!) 148/92 (!) 150/100  Pulse: 62   Resp: 18   Temp: 98.6 F (37 C)   TempSrc: Temporal   SpO2: 97%   Weight: 198 lb (89.8 kg)    Body mass index is 29.24 kg/m.    Physical Exam  General appearance:  Overweight male, cooperative and in no acute distress Head: Normocephalic, without obvious abnormality, atraumatic Respiratory: Respirations even and unlabored, normal respiratory rate Extremities: All extremities are intact.  Skin: Skin color, texture, turgor normal. No rashes seen  Psych: Appropriate mood and affect. Neurologic: Mental status: Alert, oriented to person, place, and time, thought content appropriate.    Assessment & Plan     1. Primary hypertension Uncontrolled. Change lisinopril to - lisinopril-hydrochlorothiazide (ZESTORETIC) 20-12.5 MG tablet; One and 1/2 tablet daily  Dispense: 135 tablet; Refill: 0  - CBC - Comprehensive metabolic panel - Lipid panel  2. Generalized anxiety disorder He states he cant tell any difference when he takes '20mg'$  citalopram. Will  increase to  citalopram (CELEXA) 20 MG tablet; Take 2 tablets (40 mg total) by mouth daily.  Will send in prescription prescription for '40mg'$  tablets in about a month.   3. Need for hepatitis C screening test  - Hepatitis C antibody  4. Prostate cancer screening  - PSA Total (Reflex To Free) ([facility] only)  5. B12 deficiency  - Vitamin B12  6. Hyperuricemia On allopurinol.  - Uric acid  7. Uncomplicated asthma, unspecified asthma severity, unspecified whether persistent He feels Trelegy is helping. Was diagnosed as having COPD by Dr. Asencion Partridge, but not seen in a few years.   Follow up for CPE in about 3 months.        The entirety of the information documented in the History of Present Illness, Review of Systems and Physical Exam were personally obtained by me. Portions of this information were initially documented by the CMA and reviewed by me for thoroughness and accuracy.     Lelon Huh, MD  [facility] [phone] (phone) [phone] (  fax)  [place]

## 2021-05-16 NOTE — Patient Instructions (Signed)
Please review the attached list of medications and notify my office if there are any errors.   Increase citalopram to 2 tablets daily. I'll send in a prescription for 40 mg tablets in about a month

## 2021-05-17 LAB — COMPREHENSIVE METABOLIC PANEL
ALT: 34 IU/L (ref 0–44)
AST: 29 IU/L (ref 0–40)
Albumin/Globulin Ratio: 2 (ref 1.2–2.2)
Albumin: 4.5 g/dL (ref 3.8–4.9)
Alkaline Phosphatase: 56 IU/L (ref 44–121)
BUN/Creatinine Ratio: 10 (ref 9–20)
BUN: 12 mg/dL (ref 6–24)
Bilirubin Total: 0.4 mg/dL (ref 0.0–1.2)
CO2: 20 mmol/L (ref 20–29)
Calcium: 9.2 mg/dL (ref 8.7–10.2)
Chloride: 107 mmol/L — ABNORMAL HIGH (ref 96–106)
Creatinine, Ser: 1.15 mg/dL (ref 0.76–1.27)
Globulin, Total: 2.3 g/dL (ref 1.5–4.5)
Glucose: 99 mg/dL (ref 65–99)
Potassium: 4.9 mmol/L (ref 3.5–5.2)
Sodium: 140 mmol/L (ref 134–144)
Total Protein: 6.8 g/dL (ref 6.0–8.5)
eGFR: 76 mL/min/{1.73_m2} (ref >59)

## 2021-05-17 LAB — URIC ACID: Uric Acid: 9.3 mg/dL — ABNORMAL HIGH (ref 3.8–8.4)

## 2021-05-17 LAB — CBC
Hematocrit: 43.7 % (ref 37.5–51.0)
Hemoglobin: 14.3 g/dL (ref 13.0–17.7)
MCH: 31 pg (ref 26.6–33.0)
MCHC: 32.7 g/dL (ref 31.5–35.7)
MCV: 95 fL (ref 79–97)
Platelets: 219 10*3/uL (ref 150–450)
RBC: 4.62 x10E6/uL (ref 4.14–5.80)
RDW: 13.1 % (ref 11.6–15.4)
WBC: 8.4 10*3/uL (ref 3.4–10.8)

## 2021-05-17 LAB — HEPATITIS C ANTIBODY: Hep C Virus Ab: 0.1 s/co ratio (ref 0.0–0.9)

## 2021-05-17 LAB — LIPID PANEL
Chol/HDL Ratio: 4.2 ratio (ref 0.0–5.0)
Cholesterol, Total: 135 mg/dL (ref 100–199)
HDL: 32 mg/dL — ABNORMAL LOW (ref >39)
LDL Chol Calc (NIH): 73 mg/dL (ref 0–99)
Triglycerides: 172 mg/dL — ABNORMAL HIGH (ref 0–149)
VLDL Cholesterol Cal: 30 mg/dL (ref 5–40)

## 2021-05-17 LAB — PSA TOTAL (REFLEX TO FREE): Prostate Specific Ag, Serum: 0.7 ng/mL (ref 0.0–4.0)

## 2021-05-17 LAB — VITAMIN B12: Vitamin B-12: 373 pg/mL (ref 232–1245)

## 2021-05-20 ENCOUNTER — Other Ambulatory Visit: Payer: Self-pay | Admitting: Pulmonary Disease

## 2021-05-20 DIAGNOSIS — R0981 Nasal congestion: Secondary | ICD-10-CM

## 2021-06-15 ENCOUNTER — Telehealth: Payer: Self-pay | Admitting: *Deleted

## 2021-06-15 NOTE — Telephone Encounter (Signed)
Patient's mother phoned for Mr. Modeste lab results. VM was left on 04/28/21 to return call for results. Caller is not on DPR. TC to patient left VM with results and to call back with any questions. Reviewed results and physician's note with the patient via cell#.

## 2021-07-06 ENCOUNTER — Other Ambulatory Visit: Payer: Self-pay | Admitting: Family Medicine

## 2021-07-06 DIAGNOSIS — F411 Generalized anxiety disorder: Secondary | ICD-10-CM

## 2021-07-06 NOTE — Telephone Encounter (Signed)
Requested medication (s) are due for refill today: see encounter dose change to 40 mg   Requested medication (s) are on the active medication list: yes  Last refill:  05/16/21  Future visit scheduled: yes in 1  month   Notes to clinic:  dose changed to  Celexa 40 mg  on 05/16/21 per notes Dr. Caryn Section. Please renew Rx to 40 mg.     Requested Prescriptions  Pending Prescriptions Disp Refills   citalopram (CELEXA) 20 MG tablet [Pharmacy Med Name: CITALOPRAM HBR 20 MG TABLET] 90 tablet 2    Sig: TAKE 1 TABLET BY MOUTH EVERY DAY     Psychiatry:  Antidepressants - SSRI Passed - 07/06/2021  4:23 PM      Passed - Valid encounter within last 6 months    Recent Outpatient Visits           1 month ago Primary hypertension   [facility] Birdie Sons, MD   9 months ago Head congestion   [place], PA-C   1 year ago COVID-19 virus infection   [facility], Vickki Muff, PA-C   2 years ago Abscess of scrotal wall   [facility], Vickki Muff, PA-C   2 years ago Mild persistent reactive airway disease without complication   [place], PA-C       Future Appointments             In 1 month Fisher, Kirstie Peri, MD [facility], [place]

## 2021-07-26 DIAGNOSIS — Z23 Encounter for immunization: Secondary | ICD-10-CM | POA: Diagnosis not present

## 2021-08-09 ENCOUNTER — Encounter: Payer: Self-pay | Admitting: Family Medicine

## 2021-08-09 NOTE — Progress Notes (Deleted)
Complete physical exam   Patient: Derek [name]   DOB: 06/11/1966   54 y.o. Male  MRN: [id number] Visit Date: 08/09/2021  Today's healthcare provider: Lelon Huh, MD   No chief complaint on file.  Subjective    Derek [name] is a 55 y.o. male who presents today for a complete physical exam.  He reports consuming a {diet types:17450} diet. {Exercise:19826} He generally feels {well/fairly well/poorly:18703}. He reports sleeping {well/fairly well/poorly:18703}. He {does/does not:200015} have additional problems to discuss today.  HPI  ***  Past Medical History:  Diagnosis Date   Gout    Hypertension    Past Surgical History:  Procedure Laterality Date   CERVICAL DISC SURGERY     COLONOSCOPY WITH PROPOFOL N/A 02/18/2018   Procedure: COLONOSCOPY WITH PROPOFOL;  Surgeon: Lin Landsman, MD;  Location: [facility] ENDOSCOPY;  Service: Gastroenterology;  Laterality: N/A;   TONSILLECTOMY AND ADENOIDECTOMY     Social History   Socioeconomic History   Marital status: Divorced    Spouse name: Not on file   Number of children: Not on file   Years of education: Not on file   Highest education level: Not on file  Occupational History   Not on file  Tobacco Use   Smoking status: Every Day    Packs/day: 0.50    Years: 36.00    Pack years: 18.00    Types: Cigarettes   Smokeless tobacco: Current   Tobacco comments:    1 pack every 2-3 days.  Started smoking around 55 years of age.   Substance and Sexual Activity   Alcohol use: Yes    Comment: occasionally   Drug use: No   Sexual activity: Not on file  Other Topics Concern   Not on file  Social History Narrative   Not on file   Social Determinants of Health   Financial Resource Strain: Not on file  Food Insecurity: Not on file  Transportation Needs: Not on file  Physical Activity: Not on file  Stress: Not on file  Social Connections: Not on file  Intimate Partner Violence: Not on file   Family Status  Relation  Name Status   Mother  Alive   Father  Deceased at age 93   Sister  26   Family History  Problem Relation Age of Onset   Fibromyalgia Mother    Prostate cancer Father    Hypertension Father    Healthy Sister    No Known Allergies  Patient Care Team: Chrismon, Vickki Muff, PA-C (Inactive) as PCP - General (Physician Assistant)   Medications: Outpatient Medications Prior to Visit  Medication Sig   albuterol (VENTOLIN HFA) 108 (90 Base) MCG/ACT inhaler TAKE 2 PUFFS BY MOUTH EVERY 6 HOURS AS NEEDED FOR WHEEZE OR SHORTNESS OF BREATH   allopurinol (ZYLOPRIM) 100 MG tablet TAKE 2 TABLETS (200 MG TOTAL) BY MOUTH ONCE DAILY.   atenolol (TENORMIN) 50 MG tablet Take 1 tablet (50 mg total) by mouth daily.   citalopram (CELEXA) 40 MG tablet Take 1 tablet (40 mg total) by mouth daily.   Fluticasone-Umeclidin-Vilant (TRELEGY ELLIPTA) 100-62.5-25 MCG/INH AEPB Inhale 1 puff into the lungs daily.   lisinopril-hydrochlorothiazide (ZESTORETIC) 20-12.5 MG tablet One and 1/2 tablet daily   sildenafil (VIAGRA) 100 MG tablet Take 1 tablet (100 mg total) by mouth daily as needed for erectile dysfunction.   No facility-administered medications prior to visit.    Review of Systems  {Labs  Heme  Chem  Endocrine  Serology  Results Review (optional):23779}  Objective    There were no vitals taken for this visit. {Show previous vital signs (optional):23777}  Physical Exam  ***  Last depression screening scores PHQ 2/9 Scores 05/16/2021 09/23/2020 01/08/2018  PHQ - 2 Score 0 0 0  PHQ- 9 Score 1 - 1   Last fall risk screening Fall Risk  09/23/2020  [name] in the past year? 0  Number [name] in past yr: 0  Injury with Fall? 0  Follow up [name] evaluation completed   Last Audit-C alcohol use screening No flowsheet data found. A score of 3 or more in women, and 4 or more in men indicates increased risk for alcohol abuse, EXCEPT if all of the points are from question 1   No results found for any  visits on 08/09/21.  Assessment & Plan    Routine Health Maintenance and Physical Exam  Exercise Activities and Dietary recommendations  Goals      Quit Smoking        Immunization History  Administered Date(s) Administered   Hepatitis B 11/07/2006, 12/10/2006   Influenza,inj,Quad PF,6+ Mos 08/19/2019   Influenza-Unspecified 08/13/2018   Janssen (J&J) SARS-COV-2 Vaccination 02/02/2020   Tdap 03/05/2009, 01/08/2018    Health Maintenance  Topic Date Due   Zoster Vaccines- Shingrix (1 of 2) Never done   COVID-19 Vaccine (2 - Booster for Janssen series) 03/29/2020   INFLUENZA VACCINE  05/23/2021   TETANUS/TDAP  01/09/2028   COLONOSCOPY (Pts 45-57yrs Insurance coverage will need to be confirmed)  02/19/2028   Hepatitis C Screening  Completed   HIV Screening  Completed   HPV VACCINES  Aged Out    Discussed health benefits of physical activity, and encouraged him to engage in regular exercise appropriate for his age and condition.  ***  No follow-ups on file.     {provider attestation***:1}   Lelon Huh, MD  [facility] [phone] (phone) [phone] (fax)  [place]

## 2021-08-15 ENCOUNTER — Other Ambulatory Visit: Payer: Self-pay | Admitting: Family Medicine

## 2021-08-15 DIAGNOSIS — I10 Essential (primary) hypertension: Secondary | ICD-10-CM

## 2021-09-08 ENCOUNTER — Other Ambulatory Visit: Payer: Self-pay | Admitting: Family Medicine

## 2021-09-08 DIAGNOSIS — I10 Essential (primary) hypertension: Secondary | ICD-10-CM

## 2021-09-10 ENCOUNTER — Other Ambulatory Visit: Payer: Self-pay | Admitting: Pulmonary Disease

## 2021-09-10 DIAGNOSIS — R0981 Nasal congestion: Secondary | ICD-10-CM

## 2021-09-19 ENCOUNTER — Telehealth: Payer: Self-pay | Admitting: Family Medicine

## 2021-09-19 DIAGNOSIS — I10 Essential (primary) hypertension: Secondary | ICD-10-CM

## 2021-09-19 MED ORDER — ATENOLOL 50 MG PO TABS: 50.0000 mg | ORAL_TABLET | Freq: Every day | ORAL | 1 refills | Status: DC

## 2021-09-19 NOTE — Addendum Note (Signed)
Addended by: Doristine Devoid on: 09/19/2021 02:36 PM   Modules accepted: Orders

## 2021-09-19 NOTE — Telephone Encounter (Signed)
[facility] faxed refill request for the following medications:  atenolol (TENORMIN) 50 MG tablet    Please advise.

## 2021-09-24 ENCOUNTER — Other Ambulatory Visit: Payer: Self-pay | Admitting: Pulmonary Disease

## 2021-10-05 ENCOUNTER — Telehealth: Payer: Self-pay | Admitting: Family Medicine

## 2021-10-05 DIAGNOSIS — R0981 Nasal congestion: Secondary | ICD-10-CM

## 2021-10-05 NOTE — Telephone Encounter (Signed)
Medication Refill - Medication: albuterol (VENTOLIN HFA) 108 (90 Base) MCG/ACT inhaler  Has the patient contacted their pharmacy? Yes.   [facility] told him to reach out to his PCP.   (Agent: If yes, when and what did the pharmacy advise?)  Preferred Pharmacy (with phone number or street name):   [id number] [place], [place] - [address]  [address] [place] [address]  Phone: [phone] Fax: [phone]   Has the patient been seen for an appointment in the last year OR does the patient have an upcoming appointment? Yes.    Agent: Please be advised that RX refills may take up to 3 business days. We ask that you follow-up with your pharmacy.

## 2021-10-05 NOTE — Telephone Encounter (Signed)
Medication last filled by Dr. Patsey Berthold on 02/28/21, patients last office visit was 05/16/21 okay to refill under you? KW

## 2021-10-06 MED ORDER — ALBUTEROL SULFATE HFA 108 (90 BASE) MCG/ACT IN AERS: INHALATION_SPRAY | RESPIRATORY_TRACT | 2 refills | Status: DC

## 2021-11-21 ENCOUNTER — Other Ambulatory Visit: Payer: Self-pay

## 2021-11-21 MED ORDER — FLUTICASONE-UMECLIDIN-VILANT 100-62.5-25 MCG/ACT IN AEPB: 1.0000 | INHALATION_SPRAY | Freq: Every day | RESPIRATORY_TRACT | 0 refills | Status: DC

## 2021-11-21 NOTE — Telephone Encounter (Signed)
Spoke to patient, who is requesting Rx for trelegy. Rx sent to preferred pharmacy.  Appt scheduled 12/16/2021 at 2:30. Nothing further needed at this time.

## 2021-12-06 ENCOUNTER — Other Ambulatory Visit: Payer: Self-pay | Admitting: Family Medicine

## 2021-12-06 DIAGNOSIS — I10 Essential (primary) hypertension: Secondary | ICD-10-CM

## 2021-12-16 ENCOUNTER — Other Ambulatory Visit: Payer: Self-pay

## 2021-12-16 ENCOUNTER — Encounter: Payer: Self-pay | Admitting: Adult Health

## 2021-12-16 ENCOUNTER — Ambulatory Visit: Payer: BC Managed Care – PPO | Admitting: Adult Health

## 2021-12-16 VITALS — BP 140/82 | HR 70 | Temp 98.2°F | Ht 69.0 in | Wt 198.6 lb

## 2021-12-16 DIAGNOSIS — Z720 Tobacco use: Secondary | ICD-10-CM | POA: Diagnosis not present

## 2021-12-16 DIAGNOSIS — F17210 Nicotine dependence, cigarettes, uncomplicated: Secondary | ICD-10-CM | POA: Diagnosis not present

## 2021-12-16 DIAGNOSIS — J449 Chronic obstructive pulmonary disease, unspecified: Secondary | ICD-10-CM | POA: Insufficient documentation

## 2021-12-16 DIAGNOSIS — R0981 Nasal congestion: Secondary | ICD-10-CM

## 2021-12-16 DIAGNOSIS — F17200 Nicotine dependence, unspecified, uncomplicated: Secondary | ICD-10-CM

## 2021-12-16 DIAGNOSIS — J441 Chronic obstructive pulmonary disease with (acute) exacerbation: Secondary | ICD-10-CM | POA: Diagnosis not present

## 2021-12-16 MED ORDER — DOXYCYCLINE HYCLATE 100 MG PO TABS: 100.0000 mg | ORAL_TABLET | Freq: Two times a day (BID) | ORAL | 0 refills | Status: DC

## 2021-12-16 MED ORDER — ALBUTEROL SULFATE HFA 108 (90 BASE) MCG/ACT IN AERS: INHALATION_SPRAY | RESPIRATORY_TRACT | 6 refills | Status: DC

## 2021-12-16 MED ORDER — FLUTICASONE-UMECLIDIN-VILANT 100-62.5-25 MCG/ACT IN AEPB
1.0000 | INHALATION_SPRAY | Freq: Every day | RESPIRATORY_TRACT | 11 refills | Status: DC
Start: 2021-12-16 — End: 2022-08-31

## 2021-12-16 MED ORDER — PREDNISONE 20 MG PO TABS: 20.0000 mg | ORAL_TABLET | Freq: Every day | ORAL | 0 refills | Status: DC

## 2021-12-16 NOTE — Assessment & Plan Note (Signed)
Smoking cessation discussed in detail 

## 2021-12-16 NOTE — Patient Instructions (Addendum)
Doxycycline 100mg  Twice daily for 1 week , take with food.  Prednisone 20mg  daily for 5 days  Mucinex DM Twice daily  As needed  cough/congestion  Continue on Trelegy 1 puff daily, rinse after use Activity as tolerated Albuterol inhaler as needed Work on not smoking Referral to the lung cancer screening program Discussed with your primary care doctor that lisinopril may be aggravating her cough Follow-up in 6 months with Dr. Patsey Berthold and As needed

## 2021-12-16 NOTE — Assessment & Plan Note (Signed)
Acute COPD exacerbation.  Patient most likely with more chronic bronchitis.  PFTs only showed minimum restriction.  With no significant airflow obstruction.  Patient is encouraged on smoking cessation.  We will treat for an acute exacerbation with antibiotics and steroids.  Patient is referred to the low-dose CT screening program.  Plan  Patient Instructions  Doxycycline 100mg  Twice daily for 1 week , take with food.  Prednisone 20mg  daily for 5 days  Mucinex DM Twice daily  As needed  cough/congestion  Continue on Trelegy 1 puff daily, rinse after use Activity as tolerated Albuterol inhaler as needed Work on not smoking Referral to the lung cancer screening program Discussed with your primary care doctor that lisinopril may be aggravating her cough Follow-up in 6 months with Dr. Patsey Berthold and As needed

## 2021-12-16 NOTE — Progress Notes (Signed)
@Patient  ID: Derek [name], male    DOB: April 23, 1966, 56 y.o.   MRN: [id number]  Chief Complaint  Patient presents with   Follow-up    Referring provider: No ref. provider found  HPI: 56 year old active smoker seen for pulmonary consult July 2021 for COPD  TEST/EVENTS :  PFTs May 11, 2020 showed mild to moderate restriction with an FEV1 at 81%, or ratio 76, FVC 682%, no significant bronchodilator response, DLCO 69%.  12/16/2021 Follow up : COPD  Patient presents for a follow-up visit.  Last seen July 2021 for pulmonary consult for COPD.  Patient was recommended to begin Trelegy inhaler.  He was set up for PFTs that showed mild to moderate restriction.  Unfortunately patient did not return for follow-up.  Patient says Patient continues to smoke.  We discussed smoking cessation in detail.  Patient qualifies for the lung cancer screening program.  Discussed this program in detail.  We will send referral over Restarted TRELEGY last month, was out of it due to insurance issues. Feels it really helps his breathing.  Gets winded with activities. Has daily cough .  Worse for last month with increased cough with thick green mucus, wheezing , and dyspnea. No recent antibiotics or steroids  Smoked x 40 yr , 1PPD , down to 1/2 PPD.  Flu shot and covid utd.  Works [employer]     No Known Allergies  Immunization History  Administered Date(s) Administered   Hepatitis B 11/07/2006, 12/10/2006   Influenza,inj,Quad PF,6+ Mos 08/19/2019   Influenza-Unspecified 08/13/2018, 08/01/2021   Janssen (J&J) SARS-COV-2 Vaccination 02/02/2020   Tdap 03/05/2009, 01/08/2018    Past Medical History:  Diagnosis Date   Gout    Hypertension     Tobacco History: Social History   Tobacco Use  Smoking Status Every Day   Packs/day: 0.50   Years: 36.00   Pack years: 18.00   Types: Cigarettes  Smokeless Tobacco Current  Tobacco Comments   7-8cig daily--12/16/2021   Ready to quit: No Counseling  given: Yes Tobacco comments: 7-8cig daily--12/16/2021   Outpatient Medications Prior to Visit  Medication Sig Dispense Refill   allopurinol (ZYLOPRIM) 100 MG tablet TAKE 2 TABLETS (200 MG TOTAL) BY MOUTH ONCE DAILY. 180 tablet 3   atenolol (TENORMIN) 50 MG tablet Take 1 tablet (50 mg total) by mouth daily. 90 tablet 1   citalopram (CELEXA) 40 MG tablet Take 1 tablet (40 mg total) by mouth daily. 90 tablet 1   lisinopril-hydrochlorothiazide (ZESTORETIC) 20-12.5 MG tablet TAKE 1.5 TABLETS BY MOUTH EVERY DAY 30 tablet 0   sildenafil (VIAGRA) 100 MG tablet Take 1 tablet (100 mg total) by mouth daily as needed for erectile dysfunction. 10 tablet 0   albuterol (VENTOLIN HFA) 108 (90 Base) MCG/ACT inhaler TAKE 2 PUFFS BY MOUTH EVERY 6 HOURS AS NEEDED FOR WHEEZE OR SHORTNESS OF BREATH 6.7 each 2   Fluticasone-Umeclidin-Vilant (TRELEGY ELLIPTA) 100-62.5-25 MCG/ACT AEPB Inhale 1 puff into the lungs daily. 60 each 0   No facility-administered medications prior to visit.     Review of Systems:   Constitutional:   No  weight loss, night sweats,  Fevers, chills,  +fatigue, or  lassitude.  HEENT:   No headaches,  Difficulty swallowing,  Tooth/dental problems, or  Sore throat,                No sneezing, itching, ear ache,  +nasal congestion, post nasal drip,   CV:  No chest pain,  Orthopnea, PND, swelling in  lower extremities, anasarca, dizziness, palpitations, syncope.   GI  No heartburn, indigestion, abdominal pain, nausea, vomiting, diarrhea, change in bowel habits, loss of appetite, bloody stools.   Resp: N  No chest wall deformity  Skin: no rash or lesions.  GU: no dysuria, change in color of urine, no urgency or frequency.  No flank pain, no hematuria   MS:  No joint pain or swelling.  No decreased range of motion.  No back pain.    Physical Exam  BP 140/82 (BP Location: Left Arm, Cuff Size: Normal)    Pulse 70    Temp 98.2 F (36.8 C) (Temporal)    Ht 5\' 9"  (1.753 m)    Wt 198 lb  9.6 oz (90.1 kg)    SpO2 99%    BMI 29.33 kg/m   GEN: A/Ox3; pleasant , NAD, well nourished    HEENT:  [place]/AT,  NOSE-clear, THROAT-clear, no lesions, no postnasal drip or exudate noted.   NECK:  Supple w/ fair ROM; no JVD; normal carotid impulses w/o bruits; no thyromegaly or nodules palpated; no lymphadenopathy.    RESP  Clear  P & A; w/o, wheezes/ rales/ or rhonchi. no accessory muscle use, no dullness to percussion  CARD:  RRR, no m/r/g, no peripheral edema, pulses intact, no cyanosis or clubbing.  GI:   Soft & nt; nml bowel sounds; no organomegaly or masses detected.   Musco: Warm bil, no deformities or joint swelling noted.   Neuro: alert, no focal deficits noted.    Skin: Warm, no lesions or rashes    Lab Results:    BMET   BNP No results found for: BNP  ProBNP No results found for: PROBNP  Imaging: No results found.    No flowsheet data found.  No results found for: NITRICOXIDE      Assessment & Plan:   COPD with acute exacerbation ([place]) Acute COPD exacerbation.  Patient most likely with more chronic bronchitis.  PFTs only showed minimum restriction.  With no significant airflow obstruction.  Patient is encouraged on smoking cessation.  We will treat for an acute exacerbation with antibiotics and steroids.  Patient is referred to the low-dose CT screening program.  Plan  Patient Instructions  Doxycycline 100mg  Twice daily for 1 week , take with food.  Prednisone 20mg  daily for 5 days  Mucinex DM Twice daily  As needed  cough/congestion  Continue on Trelegy 1 puff daily, rinse after use Activity as tolerated Albuterol inhaler as needed Work on not smoking Referral to the lung cancer screening program Discussed with your primary care doctor that lisinopril may be aggravating her cough Follow-up in 6 months with Dr. Patsey Berthold and As needed       Tobacco abuse Smoking cessation discussed in detail      Rexene Edison, NP 12/16/2021

## 2021-12-17 NOTE — Progress Notes (Signed)
Agree with the details of the visit as noted by Tammy Parrett, NP.  C. Laura Jaquaya Coyle, MD [facility] 

## 2021-12-26 ENCOUNTER — Other Ambulatory Visit: Payer: Self-pay | Admitting: Family Medicine

## 2021-12-26 DIAGNOSIS — F411 Generalized anxiety disorder: Secondary | ICD-10-CM

## 2021-12-30 ENCOUNTER — Other Ambulatory Visit: Payer: Self-pay | Admitting: Family Medicine

## 2021-12-30 DIAGNOSIS — I10 Essential (primary) hypertension: Secondary | ICD-10-CM

## 2021-12-30 NOTE — Telephone Encounter (Signed)
Requested medication (s) are due for refill today: yes ? ?Requested medication (s) are on the active medication list: yes ? ?Last refill:  12/06/21 #30 0 refill ? ?Future visit scheduled: yes in 1 week ? ?Notes to clinic:  do you want a courtesy refill #30 or for 1 week? ? ? ?  ?Requested Prescriptions  ?Pending Prescriptions Disp Refills  ? lisinopril-hydrochlorothiazide (ZESTORETIC) 20-12.5 MG tablet [Pharmacy Med Name: LISINOPRIL-HCTZ 20-12.5 MG TAB] 30 tablet 0  ?  Sig: TAKE 1 AND 1/2 TABLETS BY MOUTH EVERY DAY  ?  ? Cardiovascular:  ACEI + Diuretic Combos Failed - 12/30/2021 12:54 PM  ?  ?  Failed - Na in normal range and within 180 days  ?  Sodium  ?Date Value Ref Range Status  ?05/16/2021 140 134 - 144 mmol/L Final  ?  ?  ?  ?  Failed - K in normal range and within 180 days  ?  Potassium  ?Date Value Ref Range Status  ?05/16/2021 4.9 3.5 - 5.2 mmol/L Final  ?  ?  ?  ?  Failed - Cr in normal range and within 180 days  ?  Creatinine, Ser  ?Date Value Ref Range Status  ?05/16/2021 1.15 0.76 - 1.27 mg/dL Final  ?  ?  ?  ?  Failed - eGFR is 30 or above and within 180 days  ?  GFR calc Af Amer  ?Date Value Ref Range Status  ?02/14/2019 100 >59 mL/min/1.73 Final  ? ?GFR calc non Af Amer  ?Date Value Ref Range Status  ?02/14/2019 86 >59 mL/min/1.73 Final  ? ?eGFR  ?Date Value Ref Range Status  ?05/16/2021 76 >59 mL/min/1.73 Final  ?  ?  ?  ?  Failed - Last BP in normal range  ?  BP Readings from Last 1 Encounters:  ?12/16/21 140/82  ?  ?  ?  ?  Failed - Valid encounter within last 6 months  ?  Recent Outpatient Visits   ? ?      ? 7 months ago Primary hypertension  ? [facility] Caryn Section, Kirstie Peri, MD  ? 1 year ago Head congestion  ? [place], PA-C  ? 2 years ago COVID-19 virus infection  ? [place], PA-C  ? 2 years ago Abscess of scrotal wall  ? [place], PA-C  ? 2 years ago Mild persistent  reactive airway disease without complication  ? [place], PA-C  ? ?  ?  ?Future Appointments   ? ?        ? In 1 week Fisher, Kirstie Peri, MD [facility]  ? ?  ? ?  ?  ?  Passed - Patient is not pregnant  ?  ?  ? ?

## 2022-01-10 ENCOUNTER — Encounter: Payer: Self-pay | Admitting: Family Medicine

## 2022-01-10 ENCOUNTER — Ambulatory Visit: Payer: BC Managed Care – PPO | Admitting: Family Medicine

## 2022-01-10 ENCOUNTER — Other Ambulatory Visit: Payer: Self-pay

## 2022-01-10 VITALS — BP 113/77 | HR 77 | Temp 98.1°F | Resp 14 | Wt 197.0 lb

## 2022-01-10 DIAGNOSIS — I10 Essential (primary) hypertension: Secondary | ICD-10-CM | POA: Diagnosis not present

## 2022-01-10 DIAGNOSIS — N528 Other male erectile dysfunction: Secondary | ICD-10-CM | POA: Diagnosis not present

## 2022-01-10 DIAGNOSIS — R052 Subacute cough: Secondary | ICD-10-CM | POA: Diagnosis not present

## 2022-01-10 MED ORDER — VALSARTAN-HYDROCHLOROTHIAZIDE 160-25 MG PO TABS: 1.0000 | ORAL_TABLET | Freq: Every day | ORAL | 3 refills | Status: DC

## 2022-01-10 MED ORDER — SILDENAFIL CITRATE 100 MG PO TABS: 100.0000 mg | ORAL_TABLET | Freq: Every day | ORAL | 5 refills | Status: AC | PRN

## 2022-01-10 NOTE — Progress Notes (Signed)
?  ? ? ?I,Roshena L Chambers,acting as a scribe for Lelon Huh, MD.,have documented all relevant documentation on the behalf of Lelon Huh, MD,as directed by  Lelon Huh, MD while in the presence of Lelon Huh, MD.  ? ? ?Established patient visit ? ? ?Patient: Derek [name]   DOB: Sep 28, 1966   56 y.o. Male  MRN: [id number] ?Visit Date: 01/10/2022 ? ?Today's healthcare provider: Lelon Huh, MD  ? ?Chief Complaint  ?Patient presents with  ? Hypertension  ? Anxiety  ? ?Subjective  ?  ?HPI  ?Hypertension, follow-up ? ?BP Readings from Last 3 Encounters:  ?01/10/22 113/77  ?12/16/21 140/82  ?05/16/21 (!) 150/100  ? Wt Readings from Last 3 Encounters:  ?01/10/22 197 lb (89.4 kg)  ?12/16/21 198 lb 9.6 oz (90.1 kg)  ?05/16/21 198 lb (89.8 kg)  ?  ? ?He was last seen for hypertension 7 months ago.  ?BP at that visit was 150/100. Management since that visit includes changing lisinopril to - lisinopril-hydrochlorothiazide (ZESTORETIC) 20-12.5 MG tablet; One and 1/2 tablet daily. ? ?He reports good compliance with treatment. ?He is not having side effects.  However, he states he has been having persistent cough sometimes causing him to feel like he's going to vomit. This has been going on for several months. He recently had a follow up with his pulmonologist for COPD and was told it may be related to the ACEI.  ?He is following a Regular diet. ?He is not exercising. ?He does smoke. ? ?Use of agents associated with hypertension: none.  ? ?Outside blood pressures are not checked. ?Symptoms: ?No chest pain No chest pressure  ?No palpitations No syncope  ?Yes dyspnea No orthopnea  ?No paroxysmal nocturnal dyspnea No lower extremity edema  ? ?Pertinent labs: ?Lab Results  ?Component Value Date  ? CHOL 135 05/16/2021  ? HDL 32 (L) 05/16/2021  ? [place] 73 05/16/2021  ? TRIG 172 (H) 05/16/2021  ? CHOLHDL 4.2 05/16/2021  ? Lab Results  ?Component Value Date  ? NA 140 05/16/2021  ? K 4.9 05/16/2021  ? CREATININE 1.15  05/16/2021  ? EGFR 76 05/16/2021  ? GLUCOSE 99 05/16/2021  ? TSH 1.120 02/14/2019  ?  ? ?The 10-year ASCVD risk score (Arnett DK, et al., 2019) is: 9.2%  ? ?---------------------------------------------------------------------------------------------------  ? ?Anxiety, Follow-up ? ?He was last seen for anxiety 7 months ago. ?Changes made at last visit include increasing to citalopram (CELEXA) 20 MG tablet; Take 2 tablets (40 mg total) by mouth daily. ?  ?He reports good compliance with treatment. ?He reports good tolerance of treatment. ?He is not having side effects.  ? ?He feels his anxiety is mild and Improved since last visit. ? ?Symptoms: ?No chest pain No difficulty concentrating  ?No dizziness No fatigue  ?No feelings of losing control No insomnia  ?No irritable No palpitations  ?No panic attacks No racing thoughts  ?No shortness of breath No sweating  ?No tremors/shakes   ? ?GAD-7 Results ?GAD-7 Generalized Anxiety Disorder Screening Tool 09/23/2020  ?1. Feeling Nervous, Anxious, or on Edge 2  ?2. Not Being Able to Stop or Control Worrying 0  ?3. Worrying Too Much About Different Things 1  ?4. Trouble Relaxing 0  ?5. Being So Restless it's Hard To Sit Still 0  ?6. Becoming Easily Annoyed or Irritable 1  ?7. Feeling Afraid As If Something Awful Might Happen 1  ?Total GAD-7 Score 5  ?Difficulty At Work, Home, or Getting  Along With Others? Not difficult  at all  ? ? ?PHQ-9 Scores ?PHQ9 SCORE ONLY 01/10/2022 05/16/2021 09/23/2020  ?PHQ-9 Total Score 0 1 0  ? ? ?---------------------------------------------------------------------------------------------------  ? ?Medications: ?Outpatient Medications Prior to Visit  ?Medication Sig  ? albuterol (VENTOLIN HFA) 108 (90 Base) MCG/ACT inhaler TAKE 2 PUFFS BY MOUTH EVERY 6 HOURS AS NEEDED FOR WHEEZE OR SHORTNESS OF BREATH  ? allopurinol (ZYLOPRIM) 100 MG tablet TAKE 2 TABLETS (200 MG TOTAL) BY MOUTH ONCE DAILY.  ? atenolol (TENORMIN) 50 MG tablet Take 1 tablet (50 mg  total) by mouth daily.  ? citalopram (CELEXA) 40 MG tablet TAKE 1 TABLET BY MOUTH EVERY DAY  ? doxycycline (VIBRA-TABS) 100 MG tablet Take 1 tablet (100 mg total) by mouth 2 (two) times daily.  ? Fluticasone-Umeclidin-Vilant (TRELEGY ELLIPTA) 100-62.5-25 MCG/ACT AEPB Inhale 1 puff into the lungs daily.  ? lisinopril-hydrochlorothiazide (ZESTORETIC) 20-12.5 MG tablet TAKE 1 AND 1/2 TABLETS BY MOUTH EVERY DAY  ? predniSONE (DELTASONE) 20 MG tablet Take 1 tablet (20 mg total) by mouth daily with breakfast.  ? sildenafil (VIAGRA) 100 MG tablet Take 1 tablet (100 mg total) by mouth daily as needed for erectile dysfunction.  ? ?No facility-administered medications prior to visit.  ? ? ?Review of Systems  ?Constitutional:  Negative for appetite change, chills and fever.  ?Respiratory:  Positive for cough. Negative for chest tightness, shortness of breath and wheezing.   ?Cardiovascular:  Negative for chest pain and palpitations.  ?Gastrointestinal:  Negative for abdominal pain, nausea and vomiting.  ? ? ?  Objective  ?  ?BP 113/77 (BP Location: Right Arm, Patient Position: Sitting, Cuff Size: Large)   Pulse 77   Temp 98.1 ?F (36.7 ?C) (Oral)   Resp 14   Wt 197 lb (89.4 kg)   SpO2 98% Comment: room air  BMI 29.09 kg/m?  ? ? ?Physical Exam  ? ?General: Appearance:     ?Well developed, well nourished male in no acute distress  ?Eyes:    PERRL, conjunctiva/corneas clear, EOM's intact       ?Lungs:     Clear to auscultation bilaterally, respirations unlabored  ?Heart:    Normal heart rate. Normal rhythm. No murmurs, rubs, or gallops.    ?MS:   All extremities are intact.    ?Neurologic:   Awake, alert, oriented x 3. No apparent focal neurological defect.   ?   ?  ? Assessment & Plan  ?  ? ?1. Subacute cough ?Persistent for several months and advised by pulmonologist that it may be related to ACEI as below.  ? ?2. Primary hypertension ?Well controlled bu suspect he is having significant ACEI cough. Will try change to  valsartan-hydrochlorothiazide (DIOVAN-HCT) 160-25 MG tablet; Take 1 tablet by mouth daily.  Dispense: 90 tablet; Refill: 3 ? ?Call if any problems with BP change, otherwise follow up in July for annual CPE and BP follow up.  ? ?3. Other male erectile dysfunction ?refill sildenafil (VIAGRA) 100 MG tablet; Take 1 tablet (100 mg total) by mouth daily as needed for erectile dysfunction.  Dispense: 30 tablet; Refill: 5  ?   ? ?The entirety of the information documented in the History of Present Illness, Review of Systems and Physical Exam were personally obtained by me. Portions of this information were initially documented by the CMA and reviewed by me for thoroughness and accuracy.   ? ? ?Lelon Huh, MD  ?[facility] ?[phone] (phone) ?[phone] (fax) ? ?[facility]  ?

## 2022-01-27 ENCOUNTER — Other Ambulatory Visit: Payer: Self-pay | Admitting: Family Medicine

## 2022-01-27 DIAGNOSIS — I10 Essential (primary) hypertension: Secondary | ICD-10-CM

## 2022-01-31 ENCOUNTER — Other Ambulatory Visit: Payer: Self-pay

## 2022-01-31 DIAGNOSIS — Z87891 Personal history of nicotine dependence: Secondary | ICD-10-CM

## 2022-01-31 DIAGNOSIS — F17210 Nicotine dependence, cigarettes, uncomplicated: Secondary | ICD-10-CM

## 2022-01-31 DIAGNOSIS — Z122 Encounter for screening for malignant neoplasm of respiratory organs: Secondary | ICD-10-CM

## 2022-02-06 ENCOUNTER — Ambulatory Visit: Payer: BC Managed Care – PPO | Admitting: Medical

## 2022-02-06 ENCOUNTER — Encounter: Payer: Self-pay | Admitting: Medical

## 2022-02-06 VITALS — BP 132/88 | HR 71 | Temp 97.2°F | Resp 16

## 2022-02-06 DIAGNOSIS — W57XXXA Bitten or stung by nonvenomous insect and other nonvenomous arthropods, initial encounter: Secondary | ICD-10-CM

## 2022-02-06 MED ORDER — DOXYCYCLINE HYCLATE 100 MG PO TABS: 100.0000 mg | ORAL_TABLET | Freq: Two times a day (BID) | ORAL | 0 refills | Status: DC

## 2022-02-06 NOTE — Progress Notes (Signed)
? ?  Subjective:  ? ? Patient ID: Derek [name], male    DOB: Feb 15, 1966, 56 y.o.   MRN: [id number] ? ?HPI ? ?56 yo male in non acute distress, presents with tick bite to left side of lower chest. ?Itchy, no pain. Patient denies fever, chills, nausea or vomiting, headache, dizziness, body aches or any other symptoms.He removed living tick from chest on Friday. He has done no  wound care to the site. ? ? ?Blood pressure 132/88, pulse 71, temperature (!) 97.2 ?F (36.2 ?C), temperature source Oral, resp. rate 16, SpO2 97 %. ? ?No Known Allergies ? ?Review of Systems  ?Skin:  Positive for color change and wound. Negative for rash.  ?     Anterior left lower chest area with erythema surrounding a tick bite.  ? ?   ?Objective:  ? Physical Exam ?Constitutional:   ?   Appearance: Normal appearance.  ?Eyes:  ?   Extraocular Movements: Extraocular movements intact.  ?   Conjunctiva/sclera: Conjunctivae normal.  ?   Pupils: Pupils are equal, round, and reactive to light.  ?Pulmonary:  ?   Effort: Pulmonary effort is normal.  ?Chest:  ? ? ?Musculoskeletal:     ?   General: Normal range of motion.  ?Skin: ?   General: Skin is warm and dry.  ?   Findings: Erythema present.  ?Neurological:  ?   General: No focal deficit present.  ?   Mental Status: He is alert and oriented to person, place, and time.  ?Psychiatric:     ?   Mood and Affect: Mood normal.     ?   Behavior: Behavior normal.     ?   Thought Content: Thought content normal.     ?   Judgment: Judgment normal.  ? ?Left anterior /lateral  tick bite with localized area around the bite with erythema. ?No head noted in skin.  ?Uticarial ? ? ? ?   ?Assessment & Plan:  ?Tick bite ?Uticarial use OTC Zyrtec or Claritin daily for itching. ?Meds ordered this encounter  ?Medications  ? doxycycline (VIBRA-TABS) 100 MG tablet  ?  Sig: Take 1 tablet (100 mg total) by mouth 2 (two) times daily.  ?  Dispense:  14 tablet  ?  Refill:  0  ?Reviewed sun sensitivity with patient. To use  sunscreen. ?Wound care completed in clinic and reviewed with patient. ?Patient to return in 2 days for a recheck. ?Patient verbalizes understanding and has no questions at discharge. ?

## 2022-02-06 NOTE — Patient Instructions (Signed)
Tick Bite Information, Adult  Ticks are insects that can bite. Most ticks live in shrubs and grassy areas. They climb onto people and animals that go by. Then they bite. Some ticks carry germs that can make you sick. How can I prevent tick bites? Take these steps: Use insect repellent Use an insect repellent that has 20% or higher of the ingredients DEET, picaridin, or IR3535. Follow the instructions on the label. Put it on: Bare skin. The tops of your boots. Your pant legs. The ends of your sleeves. If you use an insect repellent that has the ingredient permethrin, follow the instructions on the label. Put it on: Clothing. Boots. Supplies or outdoor gear. Tents. When you are outside Wear long sleeves and long pants. Wear light-colored clothes. Tuck your pant legs into your socks. Stay in the middle of the trail. Do not touch the bushes. Avoid walking through long grass. Check for ticks on your clothes, hair, and skin often while you are outside. Before going inside your house, check your clothes, skin, head, neck, armpits, waist, groin, and joint areas. When you go indoors Check your clothes for ticks. Dry your clothes in a dryer on high heat for 10 minutes or more. If clothes are damp, additional time may be needed. Wash your clothes right away if they need to be washed. Use hot water. Check your pets and outdoor gear. Shower right away. Check your body for ticks. Do a full body check using a mirror. What is the right way to remove a tick? Remove the tick from your skin as soon as possible. Do not remove the tick with your bare fingers. To remove a tick that is crawling on your skin: Go outdoors and brush the tick off. Use tape or a lint roller. To remove a tick that is biting: Wash your hands. If you have latex gloves, put them on. Use tweezers, curved forceps, or a tick-removal tool to grasp the tick. Grasp the tick as close to your skin and as close to the tick's head as  possible. Gently pull up until the tick lets go. Try to keep the tick's head attached to its body. Do not twist or jerk the tick. Do not squeeze or crush the tick. Do not try to remove a tick with heat, alcohol, petroleum jelly, or fingernail polish. What should I do after taking out a tick? Throw away the tick. Do not crush a tick with your fingers. Clean the bite area and your hands with soap and water, rubbing alcohol, or an iodine wash. If an antiseptic cream or ointment is available, apply a small amount to the bite area. Wash and disinfect any instruments that you used to remove the tick. How should I get rid of a live tick? To dispose of a live tick, use one of these methods: Place the tick in rubbing alcohol. Place the tick in a bag or container you can close tightly. Wrap the tick tightly in tape. Flush the tick down the toilet. Contact a doctor if: You have symptoms, such as: A fever or chills. A red rash that makes a circle (bull's-eye rash) in the bite area. Redness and swelling where the tick bit you. Headache. Pain in a muscle, joint, or bone. Being more tired than normal. Trouble walking or moving your legs. Numbness in your legs. Tender and swollen lymph glands. A part of a tick breaks off and gets stuck in your skin. Get help right away if: You cannot remove   a tick. You cannot move (have paralysis) or feel weak. You are feeling worse or have new symptoms. You find a tick that is biting you and filled with blood. This is important if you are in an area where diseases from ticks are common. Summary Ticks may carry germs that can make you sick. To prevent tick bites wear long sleeves, long pants, and light colors. Use insect repellent. Follow the instructions on the label. If the tick is biting, do not try to remove it with heat, alcohol, petroleum jelly, or fingernail polish. Use tweezers, curved forceps, or a tick-removal tool to grasp the tick. Gently pull up  until the tick lets go. Do not twist or jerk the tick. Do not squeeze or crush the tick. If you have symptoms, contact a doctor. This information is not intended to replace advice given to you by your health care provider. Make sure you discuss any questions you have with your health care provider. Document Revised: 10/06/2019 Document Reviewed: 10/06/2019 Elsevier Patient Education  2023 [employer]  

## 2022-02-08 ENCOUNTER — Ambulatory Visit: Payer: BC Managed Care – PPO | Admitting: Medical

## 2022-02-08 VITALS — BP 120/82 | HR 62 | Temp 97.0°F | Resp 16

## 2022-02-08 DIAGNOSIS — W57XXXD Bitten or stung by nonvenomous insect and other nonvenomous arthropods, subsequent encounter: Secondary | ICD-10-CM

## 2022-02-08 NOTE — Progress Notes (Signed)
? ?  Subjective:  ? ? Patient ID: Derek [name], male    DOB: Feb 20, 1966, 56 y.o.   MRN: [id number] ? ?HPI ?56 yo male in non acute distress returns to clinic for tick bite recheck. ?Improved in redness and in itching per patient. ? ? ? ?Review of Systems  ?Skin:  Positive for wound.  ?All other systems reviewed and are negative. ? ?   ?Objective:  ? Physical Exam ? ? ?Area much improved in erythema. ?Open area were patient has been itching. ? ?   ?Assessment & Plan:  ?Tick bite ?Improving. ?Finish antibiotics, continue wound care as discussed. ?Stop touching wound unless doing wound care. ?Return to clinic as needed. ?Patient verbalizes understanding and has no questions at discharge. ? ?

## 2022-02-08 NOTE — Patient Instructions (Signed)

## 2022-02-21 ENCOUNTER — Ambulatory Visit (INDEPENDENT_AMBULATORY_CARE_PROVIDER_SITE_OTHER): Payer: BC Managed Care – PPO | Admitting: Acute Care

## 2022-02-21 ENCOUNTER — Encounter: Payer: Self-pay | Admitting: Acute Care

## 2022-02-21 DIAGNOSIS — F17210 Nicotine dependence, cigarettes, uncomplicated: Secondary | ICD-10-CM

## 2022-02-21 NOTE — Progress Notes (Addendum)
Virtual Visit via Telephone Note ? ?I connected with Derek [name] on 02/24/22 at  4:00 PM EDT by telephone and verified that I am speaking with the correct person using two identifiers. ? ?Location: ?Patient: At home ?Provider: [facility], [place], [place], [address]  ?  ?I discussed the limitations, risks, security and privacy concerns of performing an evaluation and management service by telephone and the availability of in person appointments. I also discussed with the patient that there may be a patient responsible charge related to this service. The patient expressed understanding and agreed to proceed. ? ? ?Shared Decision Making Visit Lung Cancer Screening Program ?[phone]) ? ? ?Eligibility: ?Age 56 y.o. ?Pack Years Smoking History Calculation 30 pack year smoking history ?(# packs/per year x # years smoked) ?Recent History of coughing up blood  no ?Unexplained weight loss? no ?( >Than 15 pounds within the last 6 months ) ?Prior History Lung / other cancer no ?(Diagnosis within the last 5 years already requiring surveillance chest CT Scans). ?Smoking Status Current Smoker ?Former Smokers: Years since quit:  NA ? Quit Date:  NA ? ?Visit Components: ?Discussion included one or more decision making aids. yes ?Discussion included risk/benefits of screening. yes ?Discussion included potential follow up diagnostic testing for abnormal scans. yes ?Discussion included meaning and risk of over diagnosis. yes ?Discussion included meaning and risk of False Positives. yes ?Discussion included meaning of total radiation exposure. yes ? ?Counseling Included: ?Importance of adherence to annual lung cancer LDCT screening. yes ?Impact of comorbidities on ability to participate in the program. yes ?Ability and willingness to under diagnostic treatment. yes ? ?Smoking Cessation Counseling: ?Current Smokers:  ?Discussed importance of smoking cessation. yes ?Information about tobacco cessation classes and interventions  provided to patient. yes ?Patient provided with "ticket" for LDCT Scan. yes ?Symptomatic Patient. no ? Counseling NA ?Diagnosis Code: Tobacco Use Z72.0 ?Asymptomatic Patient yes ? Counseling (Intermediate counseling: > three minutes counseling) K2409 ?Former Smokers:  ?Discussed the importance of maintaining cigarette abstinence. yes ?Diagnosis Code: Personal History of Nicotine Dependence. B35.329 ?Information about tobacco cessation classes and interventions provided to patient. Yes ?Patient provided with "ticket" for LDCT Scan. yes ?Written Order for Lung Cancer Screening with LDCT placed in Epic. Yes ?(CT Chest Lung Cancer Screening Low Dose W/O CM) [id number] ?Z12.2-Screening of respiratory organs ?Z87.891-Personal history of nicotine dependence ? ?I have spent 25 minutes of face to face/ virtual visit   time with  Derek [name] discussing the risks and benefits of lung cancer screening. We viewed / discussed a power point together that explained in detail the above noted topics. We paused at intervals to allow for questions to be asked and answered to ensure understanding.We discussed that the single most powerful action that he can take to decrease his risk of developing lung cancer is to quit smoking. We discussed whether or not he is ready to commit to setting a quit date. We discussed options for tools to aid in quitting smoking including nicotine replacement therapy, non-nicotine medications, support groups, Quit Smart classes, and behavior modification. We discussed that often times setting smaller, more achievable goals, such as eliminating 1 cigarette a day for a week and then 2 cigarettes a day for a week can be helpful in slowly decreasing the number of cigarettes smoked. This allows for a sense of accomplishment as well as providing a clinical benefit. I provided  him  with smoking cessation  information  with contact information for community resources, classes, free  nicotine replacement therapy, and  access to mobile apps, text messaging, and on-line smoking cessation help. I have also provided  him  the office contact information in the event he needs to contact me, or the screening staff. We discussed the time and location of the scan, and that either Doroteo Glassman RN, Joella Prince, RN  or I will call / send a letter with the results within 24-72 hours of receiving them. The patient verbalized understanding of all of  the above and had no further questions upon leaving the office. They have my contact information in the event they have any further questions. ? ?I spent 3 minutes counseling on smoking cessation and the health risks of continued tobacco abuse. ? ?I explained to the patient that there has been a high incidence of coronary artery disease noted on these exams. I explained that this is a non-gated exam therefore degree or severity cannot be determined. This patient is not on statin therapy. I have asked the patient to follow-up with their PCP regarding any incidental finding of coronary artery disease and management with diet or medication as their PCP  feels is clinically indicated. The patient verbalized understanding of the above and had no further questions upon completion of the visit. ? ?  ? ? ?Magdalen Spatz, NP ?02/21/2022 ? ? ? ? ? ? ?

## 2022-02-21 NOTE — Patient Instructions (Signed)
Thank you for participating in the [facility]. It was our pleasure to meet you today. We will call you with the results of your scan within the next few days. Your scan will be assigned a Lung RADS category score by the physicians reading the scans.  This Lung RADS score determines follow up scanning.  See below for description of categories, and follow up screening recommendations. We will be in touch to schedule your follow up screening annually or based on recommendations of our providers. We will fax a copy of your scan results to your Primary Care Physician, or the physician who referred you to the program, to ensure they have the results. Please call the office if you have any questions or concerns regarding your scanning experience or results.  Our office number is [phone]. Please speak with Denise Phelps, RN. , or  Denise Buckner RN, They are  our Lung Cancer Screening RN.'s If They are unavailable when you call, Please leave a message on the voice mail. We will return your call at our earliest convenience.This voice mail is monitored several times a day.  Remember, if your scan is normal, we will scan you annually as long as you continue to meet the criteria for the program. (Age 55-77, Current smoker or smoker who has quit within the last 15 years). If you are a smoker, remember, quitting is the single most powerful action that you can take to decrease your risk of lung cancer and other pulmonary, breathing related problems. We know quitting is hard, and we are here to help.  Please let us know if there is anything we can do to help you meet your goal of quitting. If you are a former smoker, congratulations. We are proud of you! Remain smoke free! Remember you can refer friends or family members through the number above.  We will screen them to make sure they meet criteria for the program. Thank you for helping us take better care of you by  participating in Lung Screening.  You can receive free nicotine replacement therapy ( patches, gum or mints) by calling 1-800-QUIT NOW. Please call so we can get you on the path to becoming  a non-smoker. I know it is hard, but you can do this!  Lung RADS Categories:  Lung RADS 1: no nodules or definitely non-concerning nodules.  Recommendation is for a repeat annual scan in 12 months.  Lung RADS 2:  nodules that are non-concerning in appearance and behavior with a very low likelihood of becoming an active cancer. Recommendation is for a repeat annual scan in 12 months.  Lung RADS 3: nodules that are probably non-concerning , includes nodules with a low likelihood of becoming an active cancer.  Recommendation is for a 6-month repeat screening scan. Often noted after an upper respiratory illness. We will be in touch to make sure you have no questions, and to schedule your 6-month scan.  Lung RADS 4 A: nodules with concerning findings, recommendation is most often for a follow up scan in 3 months or additional testing based on our provider's assessment of the scan. We will be in touch to make sure you have no questions and to schedule the recommended 3 month follow up scan.  Lung RADS 4 B:  indicates findings that are concerning. We will be in touch with you to schedule additional diagnostic testing based on our provider's  assessment of the scan.  Other options for assistance in smoking cessation (   As covered by your insurance benefits)  Hypnosis for smoking cessation  [employer] [phone]  Acupuncture for smoking cessation  [facility] [phone]   

## 2022-02-22 ENCOUNTER — Other Ambulatory Visit: Payer: Self-pay

## 2022-02-22 ENCOUNTER — Ambulatory Visit
Admission: RE | Admit: 2022-02-22 | Discharge: 2022-02-22 | Disposition: A | Discharge status: Home / Self Care | Payer: BC Managed Care – PPO | Source: Ambulatory Visit | Attending: Acute Care | Admitting: Acute Care

## 2022-02-22 DIAGNOSIS — F17210 Nicotine dependence, cigarettes, uncomplicated: Secondary | ICD-10-CM | POA: Insufficient documentation

## 2022-02-22 DIAGNOSIS — Z87891 Personal history of nicotine dependence: Secondary | ICD-10-CM

## 2022-02-22 DIAGNOSIS — Z122 Encounter for screening for malignant neoplasm of respiratory organs: Secondary | ICD-10-CM | POA: Insufficient documentation

## 2022-02-22 DIAGNOSIS — I700 Atherosclerosis of aorta: Secondary | ICD-10-CM | POA: Diagnosis not present

## 2022-02-24 ENCOUNTER — Encounter: Payer: Self-pay | Admitting: Family Medicine

## 2022-02-24 DIAGNOSIS — I700 Atherosclerosis of aorta: Secondary | ICD-10-CM | POA: Insufficient documentation

## 2022-03-09 ENCOUNTER — Other Ambulatory Visit: Payer: Self-pay | Admitting: Acute Care

## 2022-03-09 DIAGNOSIS — Z122 Encounter for screening for malignant neoplasm of respiratory organs: Secondary | ICD-10-CM

## 2022-03-09 DIAGNOSIS — Z87891 Personal history of nicotine dependence: Secondary | ICD-10-CM

## 2022-03-09 DIAGNOSIS — F17210 Nicotine dependence, cigarettes, uncomplicated: Secondary | ICD-10-CM

## 2022-03-12 ENCOUNTER — Other Ambulatory Visit: Payer: Self-pay | Admitting: Family Medicine

## 2022-03-12 DIAGNOSIS — I10 Essential (primary) hypertension: Secondary | ICD-10-CM

## 2022-04-29 ENCOUNTER — Emergency Department
Admission: EM | Admit: 2022-04-29 | Discharge: 2022-04-29 | Disposition: A | Discharge status: Home / Self Care | Payer: BC Managed Care – PPO | Attending: Student in an Organized Health Care Education/Training Program | Admitting: Student in an Organized Health Care Education/Training Program

## 2022-04-29 ENCOUNTER — Encounter: Payer: Self-pay | Admitting: Emergency Medicine

## 2022-04-29 ENCOUNTER — Other Ambulatory Visit: Payer: Self-pay

## 2022-04-29 DIAGNOSIS — M79651 Pain in right thigh: Secondary | ICD-10-CM | POA: Insufficient documentation

## 2022-04-29 DIAGNOSIS — Y93E5 Activity, floor mopping and cleaning: Secondary | ICD-10-CM | POA: Insufficient documentation

## 2022-04-29 DIAGNOSIS — W010XXA Fall on same level from slipping, tripping and stumbling without subsequent striking against object, initial encounter: Secondary | ICD-10-CM | POA: Insufficient documentation

## 2022-04-29 DIAGNOSIS — M79604 Pain in right leg: Secondary | ICD-10-CM | POA: Insufficient documentation

## 2022-04-29 DIAGNOSIS — S8991XA Unspecified injury of right lower leg, initial encounter: Secondary | ICD-10-CM | POA: Diagnosis not present

## 2022-04-29 MED ORDER — CYCLOBENZAPRINE HCL 10 MG PO TABS
10.0000 mg | ORAL_TABLET | Freq: Three times a day (TID) | ORAL | 0 refills | Status: DC | PRN
Start: 2022-04-29 — End: 2022-05-02

## 2022-04-29 NOTE — ED Provider Notes (Signed)
[facility] Provider Note    Event Date/Time   First MD Initiated Contact with Patient 04/29/22 1111     (approximate)   History   Fall and Leg Pain   HPI  Derek [name] is a 56 y.o. male previously healthy presents to the ER after injuring his right posterior leg last night while he was mopping his right floor slipped on concrete.  Felt sudden strain posterior leg.  Has been able to ambulate but pain is worsened in the hamstring with movement and concerned he may have torn something.  No swelling no contusion no laceration no head injury note other pain or discomfort.     Physical Exam   Triage Vital Signs: ED Triage Vitals  Enc Vitals Group     BP 04/29/22 1105 129/87     Pulse Rate 04/29/22 1105 74     Resp 04/29/22 1105 18     Temp 04/29/22 1105 98.2 F (36.8 C)     Temp Source 04/29/22 1105 Oral     SpO2 04/29/22 1105 97 %     Weight 04/29/22 1103 194 lb (88 kg)     Height 04/29/22 1103 '5\' 9"'$  (1.753 m)     Head Circumference --      Peak Flow --      Pain Score 04/29/22 1103 10     Pain Loc --      Pain Edu? --      Excl. in [place]? --     Most recent vital signs: Vitals:   04/29/22 1105  BP: 129/87  Pulse: 74  Resp: 18  Temp: 98.2 F (36.8 C)  SpO2: 97%     Constitutional: Alert  Eyes: Conjunctivae are normal.  Head: Atraumatic. Nose: No congestion/rhinnorhea. Mouth/Throat: Mucous membranes are moist.   Neck: Painless ROM.  Cardiovascular:   Good peripheral circulation. Respiratory: Normal respiratory effort.  No retractions.  Gastrointestinal: Soft and nontender.  Musculoskeletal:  no deformity.  No pain with logroll.  No pain of the hip or knee.  Neurovascular intact distally.  Thigh compartment is soft does have some posterior tenderness of the hamstring muscle body but motor strength intact.  No pain at the pelvis. Neurologic:  MAE spontaneously. No gross focal neurologic deficits are appreciated.  Skin:  Skin is warm, dry  and intact. No rash noted. Psychiatric: Mood and affect are normal. Speech and behavior are normal.    ED Results / Procedures / Treatments   Labs (all labs ordered are listed, but only abnormal results are displayed) Labs Reviewed - No data to display   EKG     RADIOLOGY    PROCEDURES:  Critical Care performed:   Procedures   MEDICATIONS ORDERED IN ED: Medications - No data to display   IMPRESSION / MDM / [place] / ED COURSE  I reviewed the triage vital signs and the nursing notes.                              Differential diagnosis includes, but is not limited to, muscle strain, contusion, fracture, distal Patient patient presented to the ER for evaluation of right thigh pain as described above consistent with muscle strain.  No bony tenderness to palpation able to bear weight.  Discussed conservative management, crutches for weight bearing as tolerated and follow-up as an outpatient.      FINAL CLINICAL IMPRESSION(S) / ED DIAGNOSES  Final diagnoses:  Right leg pain     Rx / DC Orders   ED Discharge Orders          Ordered    cyclobenzaprine (FLEXERIL) 10 MG tablet  3 times daily PRN        04/29/22 1121             Note:  This document was prepared using Dragon voice recognition software and may include unintentional dictation errors.    Merlyn Lot, MD 04/29/22 1124

## 2022-04-29 NOTE — ED Notes (Signed)
Pt gives verbal consent to DC 

## 2022-04-29 NOTE — ED Triage Notes (Signed)
Pt reports was mopping his garage last pm and the floor was slick and he slipped and fell and thinks he pulled a hamstring. Pt reports pain to right leg. States worse to walk but does not want a wheelchair. Denies hitting head or LOC

## 2022-05-01 ENCOUNTER — Telehealth: Payer: Self-pay

## 2022-05-01 NOTE — Telephone Encounter (Signed)
Patient's mother, Shauna Hugh called and wanted to know if an x-ray order could be placed for the patient, he his having leg pain after a fall he had 04/29/22. Pt was seen at the ED, but x-rays were not taken at that time. Patient needs a note for work excusing him from work until [place] of x-[place]. Diane would like to pick note up today after 10:00 am. No available OV today with Dr. Caryn Section.Please advise patient [phone]) [phone].

## 2022-05-01 NOTE — Telephone Encounter (Signed)
Please Review and advise 

## 2022-05-02 ENCOUNTER — Encounter: Payer: Self-pay | Admitting: Physician Assistant

## 2022-05-02 ENCOUNTER — Ambulatory Visit: Payer: BC Managed Care – PPO | Admitting: Physician Assistant

## 2022-05-02 VITALS — BP 120/71 | HR 62 | Ht 69.0 in | Wt 193.6 lb

## 2022-05-02 DIAGNOSIS — S76311A Strain of muscle, fascia and tendon of the posterior muscle group at thigh level, right thigh, initial encounter: Secondary | ICD-10-CM

## 2022-05-02 MED ORDER — BACLOFEN 10 MG PO TABS: 10.0000 mg | ORAL_TABLET | Freq: Three times a day (TID) | ORAL | 0 refills | Status: DC

## 2022-05-02 NOTE — Assessment & Plan Note (Signed)
Advised tylenol, heat/ice, stretching. Switching cyclobenzaprine to baclofen.  Advised caution w/ driving and work w/ medication If no improvement by next week advise ref to ortho, pt declines for now

## 2022-05-02 NOTE — Progress Notes (Signed)
I,Sha'taria Tyson,acting as a Education administrator for [employer], PA-C.,have documented all relevant documentation on the behalf of Derek Kirschner, PA-C,as directed by  Derek Kirschner, PA-C while in the presence of Derek Kirschner, PA-C.   Established patient visit   Patient: Derek [name]   DOB: May 12, 1966   56 y.o. Male  MRN: [id number] Visit Date: 05/02/2022  Today's healthcare provider: Mikey Kirschner, PA-C  Cc. Right leg pain post fall   Subjective    HPI    Derek [name] is a 56 y/o male who presents today post fall 04/29/22 that brought him to the ED. Reports  falling on a wet concrete floor on his right leg. Was given flexeril 10 mg TID which he states does not help the pain. He feels pain whenever he tries to extend his R leg or squat. Pain is felt in his hamstring area. Using ice/heat at home. Feels unable to work as he has to constantly move things and walk up stairs which gives him pain right now. Denies weakness, instability, changes in balance.    Medications: Outpatient Medications Prior to Visit  Medication Sig   albuterol (VENTOLIN HFA) 108 (90 Base) MCG/ACT inhaler TAKE 2 PUFFS BY MOUTH EVERY 6 HOURS AS NEEDED FOR WHEEZE OR SHORTNESS OF BREATH   allopurinol (ZYLOPRIM) 100 MG tablet TAKE 2 TABLETS (200 MG TOTAL) BY MOUTH ONCE DAILY.   atenolol (TENORMIN) 50 MG tablet TAKE 1 TABLET BY MOUTH EVERY DAY   citalopram (CELEXA) 40 MG tablet TAKE 1 TABLET BY MOUTH EVERY DAY   Fluticasone-Umeclidin-Vilant (TRELEGY ELLIPTA) 100-62.5-25 MCG/ACT AEPB Inhale 1 puff into the lungs daily.   sildenafil (VIAGRA) 100 MG tablet Take 1 tablet (100 mg total) by mouth daily as needed for erectile dysfunction.   valsartan-hydrochlorothiazide (DIOVAN-HCT) 160-25 MG tablet Take 1 tablet by mouth daily.   [DISCONTINUED] cyclobenzaprine (FLEXERIL) 10 MG tablet Take 1 tablet (10 mg total) by mouth 3 (three) times daily as needed for muscle spasms.   No facility-administered medications prior to visit.    Review  of Systems  Constitutional:  Negative for fatigue and fever.  Respiratory:  Negative for cough and shortness of breath.   Cardiovascular:  Negative for chest pain, palpitations and leg swelling.  Musculoskeletal:  Positive for gait problem and myalgias.  Neurological:  Negative for dizziness and headaches.      Objective    Blood pressure 120/71, pulse 62, height '5\' 9"'$  (1.753 m), weight 193 lb 9.6 oz (87.8 kg), SpO2 99 %.   Physical Exam Vitals reviewed.  Constitutional:      Appearance: He is not ill-appearing.  HENT:     Head: Normocephalic.  Eyes:     Conjunctiva/sclera: Conjunctivae normal.  Cardiovascular:     Rate and Rhythm: Normal rate.  Pulmonary:     Effort: Pulmonary effort is normal. No respiratory distress.  Musculoskeletal:     Comments: Pain w/ extensive of R leg. No bony tenderness or change in joint ROM. Unable to fully extend knee d/t posterior hamstring pain.   Neurological:     General: No focal deficit present.     Mental Status: He is alert and oriented to person, place, and time.  Psychiatric:        Mood and Affect: Mood normal.        Behavior: Behavior normal.     No results found for any visits on 05/02/22.  Assessment & Plan     Problem List Items Addressed This Visit  Musculoskeletal and Integument   Hamstring muscle strain, right, initial encounter - Primary    Advised tylenol, heat/ice, stretching. Switching cyclobenzaprine to baclofen.  Advised caution w/ driving and work w/ medication If no improvement by next week advise ref to ortho, pt declines for now      Relevant Medications   baclofen (LIORESAL) 10 MG tablet    Return if symptoms worsen or fail to improve.      I, Derek Kirschner, PA-C have reviewed all documentation for this visit. The documentation on  7/11/203 for the exam, diagnosis, procedures, and orders are all accurate and complete.  Derek Kirschner, PA-C [facility] [address]  [address] [place], [place], [address] Office: [phone] Fax: [place]

## 2022-05-05 ENCOUNTER — Other Ambulatory Visit: Payer: Self-pay | Admitting: Physician Assistant

## 2022-05-05 ENCOUNTER — Telehealth: Payer: Self-pay

## 2022-05-05 DIAGNOSIS — S76311A Strain of muscle, fascia and tendon of the posterior muscle group at thigh level, right thigh, initial encounter: Secondary | ICD-10-CM

## 2022-05-05 NOTE — Addendum Note (Signed)
Addended by: Barnie Mort on: 05/05/2022 04:06 PM   Modules accepted: Orders

## 2022-05-05 NOTE — Progress Notes (Signed)
Amb ref orth

## 2022-05-05 NOTE — Telephone Encounter (Signed)
Copied from Eva [phone]. Topic: General - Other >> May 05, 2022 11:53 AM Chapman Fitch wrote: Reason for CRM: Pt called to speak with Ria Comment about the hamstring he pulled / please advise / he stated she wanted to hear from him before Monday

## 2022-05-16 ENCOUNTER — Encounter: Payer: BC Managed Care – PPO | Admitting: Family Medicine

## 2022-06-28 ENCOUNTER — Other Ambulatory Visit: Payer: Self-pay | Admitting: Family Medicine

## 2022-06-28 DIAGNOSIS — F411 Generalized anxiety disorder: Secondary | ICD-10-CM

## 2022-07-10 ENCOUNTER — Other Ambulatory Visit: Payer: Self-pay | Admitting: Adult Health

## 2022-07-10 DIAGNOSIS — R0981 Nasal congestion: Secondary | ICD-10-CM

## 2022-07-25 NOTE — Progress Notes (Deleted)
I,Acel Natzke S Richardson Dubree,acting as a Education administrator for Lelon Huh, MD.,have documented all relevant documentation on the behalf of Lelon Huh, MD,as directed by  Lelon Huh, MD while in the presence of Lelon Huh, MD.   Complete physical exam   Patient: Derek [name]   DOB: December 18, 1965   56 y.o. Male  MRN: [id number] Visit Date: 07/26/2022  Today's healthcare provider: Lelon Huh, MD   No chief complaint on file.  Subjective    Derek [name] is a 55 y.o. male who presents today for a complete physical exam.  He reports consuming a {diet types:17450} diet. {Exercise:19826} He generally feels {well/fairly well/poorly:18703}. He reports sleeping {well/fairly well/poorly:18703}. He {does/does not:200015} have additional problems to discuss today.  HPI    Past Medical History:  Diagnosis Date   Gout    Hypertension    Past Surgical History:  Procedure Laterality Date   CERVICAL DISC SURGERY     COLONOSCOPY WITH PROPOFOL N/A 02/18/2018   Procedure: COLONOSCOPY WITH PROPOFOL;  Surgeon: Lin Landsman, MD;  Location: [facility] ENDOSCOPY;  Service: Gastroenterology;  Laterality: N/A;   TONSILLECTOMY AND ADENOIDECTOMY     Social History   Socioeconomic History   Marital status: Divorced    Spouse name: Not on file   Number of children: Not on file   Years of education: Not on file   Highest education level: Not on file  Occupational History   Not on file  Tobacco Use   Smoking status: Every Day    Packs/day: 0.50    Years: 36.00    Total pack years: 18.00    Types: Cigarettes   Smokeless tobacco: Current   Tobacco comments:    7-8cig daily--12/16/2021  Substance and Sexual Activity   Alcohol use: Yes    Comment: occasionally   Drug use: No   Sexual activity: Not on file  Other Topics Concern   Not on file  Social History Narrative   Not on file   Social Determinants of Health   Financial Resource Strain: Not on file  Food Insecurity: Not on file  Transportation  Needs: Not on file  Physical Activity: Not on file  Stress: Not on file  Social Connections: Not on file  Intimate Partner Violence: Not on file   Family Status  Relation Name Status   Mother  Alive   Father  Deceased at age 52   Sister  50   Family History  Problem Relation Age of Onset   Fibromyalgia Mother    Prostate cancer Father    Hypertension Father    Healthy Sister    No Known Allergies  Patient Care Team: Birdie Sons, MD as PCP - General (Family Medicine)   Medications: Outpatient Medications Prior to Visit  Medication Sig   albuterol (VENTOLIN HFA) 108 (90 Base) MCG/ACT inhaler TAKE 2 PUFFS BY MOUTH EVERY 6 HOURS AS NEEDED FOR WHEEZE OR SHORTNESS OF BREATH   allopurinol (ZYLOPRIM) 100 MG tablet TAKE 2 TABLETS (200 MG TOTAL) BY MOUTH ONCE DAILY.   atenolol (TENORMIN) 50 MG tablet TAKE 1 TABLET BY MOUTH EVERY DAY   baclofen (LIORESAL) 10 MG tablet Take 1 tablet (10 mg total) by mouth 3 (three) times daily.   citalopram (CELEXA) 40 MG tablet TAKE 1 TABLET BY MOUTH EVERY DAY   Fluticasone-Umeclidin-Vilant (TRELEGY ELLIPTA) 100-62.5-25 MCG/ACT AEPB Inhale 1 puff into the lungs daily.   sildenafil (VIAGRA) 100 MG tablet Take 1 tablet (100 mg total) by mouth daily as needed  for erectile dysfunction.   valsartan-hydrochlorothiazide (DIOVAN-HCT) 160-25 MG tablet Take 1 tablet by mouth daily.   No facility-administered medications prior to visit.    Review of Systems  All other systems reviewed and are negative.  Last CBC Lab Results  Component Value Date   WBC 8.4 05/16/2021   HGB 14.3 05/16/2021   HCT 43.7 05/16/2021   MCV 95 05/16/2021   MCH 31.0 05/16/2021   RDW 13.1 05/16/2021   PLT 219 98/33/8250   Last metabolic panel Lab Results  Component Value Date   GLUCOSE 99 05/16/2021   NA 140 05/16/2021   K 4.9 05/16/2021   CL 107 (H) 05/16/2021   CO2 20 05/16/2021   BUN 12 05/16/2021   CREATININE 1.15 05/16/2021   EGFR 76 05/16/2021   CALCIUM  9.2 05/16/2021   PROT 6.8 05/16/2021   ALBUMIN 4.5 05/16/2021   LABGLOB 2.3 05/16/2021   AGRATIO 2.0 05/16/2021   BILITOT 0.4 05/16/2021   ALKPHOS 56 05/16/2021   AST 29 05/16/2021   ALT 34 05/16/2021   Last lipids Lab Results  Component Value Date   CHOL 135 05/16/2021   HDL 32 (L) 05/16/2021   LDLCALC 73 05/16/2021   TRIG 172 (H) 05/16/2021   CHOLHDL 4.2 05/16/2021   Last thyroid functions Lab Results  Component Value Date   TSH 1.120 02/14/2019   Last vitamin B12 and Folate Lab Results  Component Value Date   VITAMINB12 373 05/16/2021      Objective    There were no vitals taken for this visit. BP Readings from Last 3 Encounters:  05/02/22 120/71  04/29/22 129/87  02/08/22 120/82   Wt Readings from Last 3 Encounters:  05/02/22 193 lb 9.6 oz (87.8 kg)  04/29/22 194 lb (88 kg)  02/21/22 198 lb (89.8 kg)       Physical Exam  ***  Last depression screening scores    01/10/2022    4:14 PM 05/16/2021    9:09 AM 09/23/2020    3:51 PM  PHQ 2/9 Scores  PHQ - 2 Score 0 0 0  PHQ- 9 Score 0 1    Last fall risk screening    09/23/2020    3:50 PM  [place] in the past year? 0  Number falls in past yr: 0  Injury with Fall? 0  Follow up Falls evaluation completed   Last Audit-C alcohol use screening     No data to display         A score of 3 or more in women, and 4 or more in men indicates increased risk for alcohol abuse, EXCEPT if all of the points are from question 1   No results found for any visits on 07/26/22.  Assessment & Plan    Routine Health Maintenance and Physical Exam  Exercise Activities and Dietary recommendations  Goals      Quit Smoking        Immunization History  Administered Date(s) Administered   Hepatitis B 11/07/2006, 12/10/2006   Influenza,inj,Quad PF,6+ Mos 08/19/2019   Influenza-Unspecified 08/13/2018, 08/01/2021   Janssen (J&J) SARS-COV-2 Vaccination 02/02/2020   Tdap 03/05/2009, 01/08/2018     Health Maintenance  Topic Date Due   Zoster Vaccines- Shingrix (1 of 2) Never done   COVID-19 Vaccine (2 - Booster for Janssen series) 03/29/2020   INFLUENZA VACCINE  05/23/2022   TETANUS/TDAP  01/09/2028   COLONOSCOPY (Pts 45-79yr Insurance coverage will need to be confirmed)  02/19/2028  Hepatitis C Screening  Completed   HIV Screening  Completed   HPV VACCINES  Aged Out    Discussed health benefits of physical activity, and encouraged him to engage in regular exercise appropriate for his age and condition.  ***  No follow-ups on file.     {provider attestation***:1}   Lelon Huh, MD  [facility] [phone] (phone) [phone] (fax)  [place]

## 2022-07-26 ENCOUNTER — Encounter: Payer: BC Managed Care – PPO | Admitting: Family Medicine

## 2022-08-01 ENCOUNTER — Ambulatory Visit (INDEPENDENT_AMBULATORY_CARE_PROVIDER_SITE_OTHER): Payer: BC Managed Care – PPO | Admitting: Physician Assistant

## 2022-08-01 ENCOUNTER — Encounter: Payer: Self-pay | Admitting: Physician Assistant

## 2022-08-01 VITALS — BP 134/88 | HR 69 | Temp 98.2°F | Resp 16 | Ht 69.0 in | Wt 195.0 lb

## 2022-08-01 DIAGNOSIS — I10 Essential (primary) hypertension: Secondary | ICD-10-CM | POA: Diagnosis not present

## 2022-08-01 DIAGNOSIS — Z720 Tobacco use: Secondary | ICD-10-CM

## 2022-08-01 DIAGNOSIS — F411 Generalized anxiety disorder: Secondary | ICD-10-CM | POA: Insufficient documentation

## 2022-08-01 DIAGNOSIS — R079 Chest pain, unspecified: Secondary | ICD-10-CM

## 2022-08-01 DIAGNOSIS — M79605 Pain in left leg: Secondary | ICD-10-CM | POA: Diagnosis not present

## 2022-08-01 DIAGNOSIS — J449 Chronic obstructive pulmonary disease, unspecified: Secondary | ICD-10-CM

## 2022-08-01 DIAGNOSIS — Z0000 Encounter for general adult medical examination without abnormal findings: Secondary | ICD-10-CM | POA: Diagnosis not present

## 2022-08-01 DIAGNOSIS — E781 Pure hyperglyceridemia: Secondary | ICD-10-CM

## 2022-08-01 DIAGNOSIS — M109 Gout, unspecified: Secondary | ICD-10-CM

## 2022-08-01 DIAGNOSIS — Z23 Encounter for immunization: Secondary | ICD-10-CM

## 2022-08-01 DIAGNOSIS — Z125 Encounter for screening for malignant neoplasm of prostate: Secondary | ICD-10-CM

## 2022-08-01 NOTE — Assessment & Plan Note (Signed)
Pt manages with Celexa 40 mg daily  Chronic, well controlled

## 2022-08-01 NOTE — Assessment & Plan Note (Signed)
Pt is still smoking 1/2 pack daily Last ldct 02/22/2022

## 2022-08-01 NOTE — Assessment & Plan Note (Addendum)
Well controlled continue current medications Ordered cmp

## 2022-08-01 NOTE — Assessment & Plan Note (Signed)
Denies recent flare Will check uric acid for stability

## 2022-08-01 NOTE — Assessment & Plan Note (Addendum)
Pt manages with trelegy  F/b pulm

## 2022-08-01 NOTE — Assessment & Plan Note (Signed)
Could be Medial tibial stress syndrome but d/t smoking would like to r/o dvt Pt given ED precautions and stat order placed for L venous doppler

## 2022-08-01 NOTE — Progress Notes (Signed)
I,Roshena L Chambers,acting as a scribe for [employer], PA-C.,have documented all relevant documentation on the behalf of Derek Kirschner, PA-C,as directed by  Derek Kirschner, PA-C while in the presence of Derek Kirschner, PA-C.    Complete physical exam   Patient: Derek [name]   DOB: April 28, 1966   56 y.o. Male  MRN: [id number] Visit Date: 08/01/2022  Today's healthcare provider: Mikey Kirschner, PA-C   Chief Complaint  Patient presents with   Annual Exam   Subjective    Derek [name] is a 56 y.o. male who presents today for a complete physical exam.  He reports consuming a general diet. The patient does not participate in regular exercise at present. He generally feels fairly well. He reports sleeping fairly well. He does have additional problems to discuss today (left calf pain x 1 day).  HPI    Pt reports intermittent chest pain, < 5 minutes, sometimes associated w/ SOB. Unsure of association with activity. Denies associated radiating pain, diaphoresis. Pt requests referral to cardiology. Denies current chest pain.  Pt reports L lower leg pain x 1 day, sharp pain from medial/posterior calf to L shin. Worse with pressure or movement. Pt appreciates some swelling. Denies recent inactivity--sitting for long periods. Pt is a smoker.  Past Medical History:  Diagnosis Date   Gout    Hypertension    Past Surgical History:  Procedure Laterality Date   CERVICAL DISC SURGERY     COLONOSCOPY WITH PROPOFOL N/A 02/18/2018   Procedure: COLONOSCOPY WITH PROPOFOL;  Surgeon: Lin Landsman, MD;  Location: [facility] ENDOSCOPY;  Service: Gastroenterology;  Laterality: N/A;   TONSILLECTOMY AND ADENOIDECTOMY     Social History   Socioeconomic History   Marital status: Divorced    Spouse name: Not on file   Number of children: Not on file   Years of education: Not on file   Highest education level: Not on file  Occupational History   Not on file  Tobacco Use   Smoking status: Every Day     Packs/day: 0.50    Years: 36.00    Total pack years: 18.00    Types: Cigarettes   Smokeless tobacco: Current   Tobacco comments:    7-8cig daily--12/16/2021  Substance and Sexual Activity   Alcohol use: Yes    Comment: occasionally   Drug use: No   Sexual activity: Not on file  Other Topics Concern   Not on file  Social History Narrative   Not on file   Social Determinants of Health   Financial Resource Strain: Not on file  Food Insecurity: Not on file  Transportation Needs: Not on file  Physical Activity: Not on file  Stress: Not on file  Social Connections: Not on file  Intimate Partner Violence: Not on file   Family Status  Relation Name Status   Mother  Alive   Father  Deceased at age 60   Sister  14   Family History  Problem Relation Age of Onset   Fibromyalgia Mother    Prostate cancer Father    Hypertension Father    Healthy Sister    No Known Allergies  Patient Care Team: Birdie Sons, MD as PCP - General (Family Medicine)   Medications: Outpatient Medications Prior to Visit  Medication Sig   albuterol (VENTOLIN HFA) 108 (90 Base) MCG/ACT inhaler TAKE 2 PUFFS BY MOUTH EVERY 6 HOURS AS NEEDED FOR WHEEZE OR SHORTNESS OF BREATH   allopurinol (ZYLOPRIM) 100 MG tablet  TAKE 2 TABLETS (200 MG TOTAL) BY MOUTH ONCE DAILY.   atenolol (TENORMIN) 50 MG tablet TAKE 1 TABLET BY MOUTH EVERY DAY   baclofen (LIORESAL) 10 MG tablet Take 1 tablet (10 mg total) by mouth 3 (three) times daily.   citalopram (CELEXA) 40 MG tablet TAKE 1 TABLET BY MOUTH EVERY DAY   Fluticasone-Umeclidin-Vilant (TRELEGY ELLIPTA) 100-62.5-25 MCG/ACT AEPB Inhale 1 puff into the lungs daily.   sildenafil (VIAGRA) 100 MG tablet Take 1 tablet (100 mg total) by mouth daily as needed for erectile dysfunction.   valsartan-hydrochlorothiazide (DIOVAN-HCT) 160-25 MG tablet Take 1 tablet by mouth daily.   No facility-administered medications prior to visit.    Review of Systems   Constitutional:  Negative for appetite change, chills, fatigue and fever.  HENT:  Negative for congestion, ear pain, hearing loss, nosebleeds and trouble swallowing.   Eyes:  Negative for pain and visual disturbance.  Respiratory:  Positive for shortness of breath. Negative for cough and chest tightness.   Cardiovascular:  Negative for chest pain, palpitations and leg swelling.  Gastrointestinal:  Negative for abdominal pain, blood in stool, constipation, diarrhea, nausea and vomiting.  Endocrine: Negative for polydipsia, polyphagia and polyuria.  Genitourinary:  Positive for frequency. Negative for dysuria and flank pain.  Musculoskeletal:  Positive for myalgias (left calf pain) and neck pain. Negative for arthralgias, back pain, joint swelling and neck stiffness.  Skin:  Negative for color change, rash and wound.  Neurological:  Negative for dizziness, tremors, seizures, speech difficulty, weakness, light-headedness and headaches.  Psychiatric/Behavioral:  Negative for behavioral problems, confusion, decreased concentration, dysphoric mood and sleep disturbance. The patient is not nervous/anxious.   All other systems reviewed and are negative.    Objective    BP 134/88 (BP Location: Right Arm, Patient Position: Sitting, Cuff Size: Large)   Pulse 69   Temp 98.2 F (36.8 C) (Oral)   Resp 16   Ht '5\' 9"'$  (1.753 m)   Wt 195 lb (88.5 kg)   SpO2 97% Comment: room air  BMI 28.80 kg/m    Physical Exam Constitutional:      General: He is awake.     Appearance: He is well-developed.  HENT:     Head: Normocephalic.     Right Ear: Tympanic membrane, ear canal and external ear normal.     Left Ear: Tympanic membrane, ear canal and external ear normal.     Nose: Nose normal. No congestion or rhinorrhea.     Mouth/Throat:     Mouth: Mucous membranes are moist.     Pharynx: No oropharyngeal exudate or posterior oropharyngeal erythema.  Eyes:     Pupils: Pupils are equal, round, and  reactive to light.  Cardiovascular:     Rate and Rhythm: Normal rate and regular rhythm.     Heart sounds: Normal heart sounds.  Pulmonary:     Effort: Pulmonary effort is normal.     Breath sounds: Normal breath sounds.  Abdominal:     General: There is no distension.     Palpations: Abdomen is soft.     Tenderness: There is no abdominal tenderness. There is no guarding.  Musculoskeletal:     Cervical back: Normal range of motion.     Right lower leg: No edema.     Left lower leg: No edema.  Lymphadenopathy:     Cervical: No cervical adenopathy.  Skin:    General: Skin is warm.  Neurological:     Mental Status: He is alert  and oriented to person, place, and time.  Psychiatric:        Attention and Perception: Attention normal.        Mood and Affect: Mood normal.        Speech: Speech normal.        Behavior: Behavior normal. Behavior is cooperative.      Last depression screening scores    08/01/2022    2:45 PM 01/10/2022    4:14 PM 05/16/2021    9:09 AM  PHQ 2/9 Scores  PHQ - 2 Score 0 0 0  PHQ- 9 Score 1 0 1   Last fall risk screening    08/01/2022    2:46 PM  [place] in the past year? 0  Number falls in past yr: 0  Injury with Fall? 0  Risk for fall due to : No Fall Risks  Follow up Falls evaluation completed   Last Audit-C alcohol use screening     No data to display         A score of 3 or more in women, and 4 or more in men indicates increased risk for alcohol abuse, EXCEPT if all of the points are from question 1   No results found for any visits on 08/01/22.  Assessment & Plan    Routine Health Maintenance and Physical Exam  Exercise Activities and Dietary recommendations  Goals      Quit Smoking      --balanced diet high in fiber and protein, low in sugars, carbs, fats. --physical activity/exercise 30 minutes 3-5 times a week    Immunization History  Administered Date(s) Administered   Hepatitis B 11/07/2006, 12/10/2006    Influenza,inj,Quad PF,6+ Mos 08/19/2019   Influenza-Unspecified 08/13/2018, 08/01/2021   Janssen (J&J) SARS-COV-2 Vaccination 02/02/2020   Tdap 03/05/2009, 01/08/2018    Health Maintenance  Topic Date Due   Zoster Vaccines- Shingrix (1 of 2) Never done   COVID-19 Vaccine (2 - Booster for Janssen series) 03/29/2020   INFLUENZA VACCINE  05/23/2022   TETANUS/TDAP  01/09/2028   COLONOSCOPY (Pts 45-35yr Insurance coverage will need to be confirmed)  02/19/2028   Hepatitis C Screening  Completed   HIV Screening  Completed   HPV VACCINES  Aged Out    Discussed health benefits of physical activity, and encouraged him to engage in regular exercise appropriate for his age and condition.  Problem List Items Addressed This Visit       Cardiovascular and Mediastinum   Primary hypertension    Well controlled continue current medications Ordered cmp       Relevant Orders   Comprehensive Metabolic Panel (CMET)     Respiratory   COPD (chronic obstructive pulmonary disease) (HProgress    Pt manages with trelegy  F/b pulm         Other   Gout    Denies recent flare Will check uric acid for stability      Relevant Orders   Uric acid   Tobacco use    Pt is still smoking 1/2 pack daily Last ldct 02/22/2022      GAD (generalized anxiety disorder)    Pt manages with Celexa 40 mg daily  Chronic, well controlled      Hypertriglyceridemia    Historically. Pt is not on a statin . Will do fasting lipids The 10-year ASCVD risk score (Arnett DK, et al., 2019) is: 13.1%       Relevant Orders   CBC w/Diff/Platelet  Lipid Profile   Chest pain    Ref to cardiology. May need stress test/CAC score      Relevant Orders   Ambulatory referral to Cardiology   Left leg pain    Could be Medial tibial stress syndrome but d/t smoking would like to r/o dvt Pt given ED precautions and stat order placed for L venous doppler      Relevant Orders   US Venous Img Lower Unilateral Left (DVT)    Other Visit Diagnoses     Annual physical exam    -  Primary   Prostate cancer screening       Relevant Orders   PSA        Return in about 6 months (around 01/31/2023) for chronic conditions.     I, Derek Kirschner, PA-C have reviewed all documentation for this visit. The documentation on  08/01/2022  for the exam, diagnosis, procedures, and orders are all accurate and complete.  Derek Kirschner, PA-C [facility] [address] [place], [place], [address] Office: [phone] Fax: [place]

## 2022-08-01 NOTE — Assessment & Plan Note (Signed)
Historically. Pt is not on a statin . Will do fasting lipids The 10-year ASCVD risk score (Arnett DK, et al., 2019) is: 13.1%

## 2022-08-01 NOTE — Assessment & Plan Note (Signed)
Ref to cardiology. May need stress test/CAC score

## 2022-08-03 ENCOUNTER — Ambulatory Visit: Admission: RE | Admit: 2022-08-03 | Payer: BC Managed Care – PPO | Source: Ambulatory Visit

## 2022-08-07 DIAGNOSIS — E781 Pure hyperglyceridemia: Secondary | ICD-10-CM | POA: Diagnosis not present

## 2022-08-07 DIAGNOSIS — M109 Gout, unspecified: Secondary | ICD-10-CM | POA: Diagnosis not present

## 2022-08-07 DIAGNOSIS — I10 Essential (primary) hypertension: Secondary | ICD-10-CM | POA: Diagnosis not present

## 2022-08-07 DIAGNOSIS — Z125 Encounter for screening for malignant neoplasm of prostate: Secondary | ICD-10-CM | POA: Diagnosis not present

## 2022-08-08 LAB — COMPREHENSIVE METABOLIC PANEL
ALT: 36 IU/L (ref 0–44)
AST: 25 IU/L (ref 0–40)
Albumin/Globulin Ratio: 1.6 (ref 1.2–2.2)
Albumin: 4.5 g/dL (ref 3.8–4.9)
Alkaline Phosphatase: 62 IU/L (ref 44–121)
BUN/Creatinine Ratio: 14 (ref 9–20)
BUN: 21 mg/dL (ref 6–24)
Bilirubin Total: <0.2 mg/dL (ref 0.0–1.2)
CO2: 18 mmol/L — ABNORMAL LOW (ref 20–29)
Calcium: 9.2 mg/dL (ref 8.7–10.2)
Chloride: 100 mmol/L (ref 96–106)
Creatinine, Ser: 1.48 mg/dL — ABNORMAL HIGH (ref 0.76–1.27)
Globulin, Total: 2.8 g/dL (ref 1.5–4.5)
Glucose: 95 mg/dL (ref 70–99)
Potassium: 4.4 mmol/L (ref 3.5–5.2)
Sodium: 137 mmol/L (ref 134–144)
Total Protein: 7.3 g/dL (ref 6.0–8.5)
eGFR: 55 mL/min/{1.73_m2} — ABNORMAL LOW (ref >59)

## 2022-08-08 LAB — CBC WITH DIFFERENTIAL/PLATELET
Basophils Absolute: 0 10*3/uL (ref 0.0–0.2)
Basos: 0 %
EOS (ABSOLUTE): 0.4 10*3/uL (ref 0.0–0.4)
Eos: 5 %
Hematocrit: 40 % (ref 37.5–51.0)
Hemoglobin: 13.9 g/dL (ref 13.0–17.7)
Immature Grans (Abs): 0 10*3/uL (ref 0.0–0.1)
Immature Granulocytes: 1 %
Lymphocytes Absolute: 2 10*3/uL (ref 0.7–3.1)
Lymphs: 25 %
MCH: 33.7 pg — ABNORMAL HIGH (ref 26.6–33.0)
MCHC: 34.8 g/dL (ref 31.5–35.7)
MCV: 97 fL (ref 79–97)
Monocytes Absolute: 0.7 10*3/uL (ref 0.1–0.9)
Monocytes: 8 %
Neutrophils Absolute: 4.9 10*3/uL (ref 1.4–7.0)
Neutrophils: 61 %
Platelets: 223 10*3/uL (ref 150–450)
RBC: 4.13 x10E6/uL — ABNORMAL LOW (ref 4.14–5.80)
RDW: 12.8 % (ref 11.6–15.4)
WBC: 8 10*3/uL (ref 3.4–10.8)

## 2022-08-08 LAB — LIPID PANEL
Chol/HDL Ratio: 7 ratio — ABNORMAL HIGH (ref 0.0–5.0)
Cholesterol, Total: 161 mg/dL (ref 100–199)
HDL: 23 mg/dL — ABNORMAL LOW (ref >39)
LDL Chol Calc (NIH): 41 mg/dL (ref 0–99)
Triglycerides: 697 mg/dL (ref 0–149)
VLDL Cholesterol Cal: 97 mg/dL — ABNORMAL HIGH (ref 5–40)

## 2022-08-08 LAB — PSA: Prostate Specific Ag, Serum: 1 ng/mL (ref 0.0–4.0)

## 2022-08-08 LAB — URIC ACID: Uric Acid: 11.7 mg/dL — ABNORMAL HIGH (ref 3.8–8.4)

## 2022-08-09 ENCOUNTER — Other Ambulatory Visit: Payer: Self-pay | Admitting: Physician Assistant

## 2022-08-09 DIAGNOSIS — N179 Acute kidney failure, unspecified: Secondary | ICD-10-CM

## 2022-08-09 DIAGNOSIS — I10 Essential (primary) hypertension: Secondary | ICD-10-CM

## 2022-08-09 MED ORDER — VALSARTAN 160 MG PO TABS: 160.0000 mg | ORAL_TABLET | Freq: Every day | ORAL | 1 refills | Status: DC

## 2022-08-09 MED ORDER — AMLODIPINE BESYLATE 5 MG PO TABS: 5.0000 mg | ORAL_TABLET | Freq: Every day | ORAL | 1 refills | Status: DC

## 2022-08-16 ENCOUNTER — Ambulatory Visit: Payer: Self-pay | Admitting: *Deleted

## 2022-08-16 MED ORDER — COLCHICINE 0.6 MG PO TABS: ORAL_TABLET | ORAL | 1 refills | Status: DC

## 2022-08-16 NOTE — Addendum Note (Signed)
Addended by: Birdie Sons on: 08/16/2022 01:58 PM   Modules accepted: Orders

## 2022-08-16 NOTE — Telephone Encounter (Signed)
Summary: gout   Pt has gout in his left foot and he asked if provider can prescribe a different medication because the allopurinol (ZYLOPRIM) 100 MG tablet is not helping at all / please advise      Reason for Disposition . [1] SEVERE pain (e.g., excruciating, unable to do any normal activities) AND [2] not improved after 2 hours of pain medicine  Answer Assessment - Initial Assessment Questions 1. ONSET: "When did the pain start?"      5-6 days 2. LOCATION: "Where is the pain located?"      Left foot- whole foot 3. PAIN: "How bad is the pain?"    (Scale 1-10; or mild, moderate, severe)  - MILD (1-3): doesn't interfere with normal activities.   - MODERATE (4-7): interferes with normal activities (e.g., work or school) or awakens from sleep, limping.   - SEVERE (8-10): excruciating pain, unable to do any normal activities, unable to walk.      severe 4. WORK OR EXERCISE: "Has there been any recent work or exercise that involved this part of the body?"      no 5. CAUSE: "What do you think is causing the foot pain?"     Food/alcohol  6. OTHER SYMPTOMS: "Do you have any other symptoms?" (e.g., leg pain, rash, fever, numbness)     no 7. PREGNANCY: "Is there any chance you are pregnant?" "When was your last menstrual period?"  Protocols used: Foot Pain-A-AH

## 2022-08-16 NOTE — Addendum Note (Signed)
Addended by: Birdie Sons on: 08/16/2022 01:59 PM   Modules accepted: Orders

## 2022-08-16 NOTE — Telephone Encounter (Addendum)
  Chief Complaint: medication request- gout Symptoms: pain in foot- left Frequency: 5-6 days Pertinent Negatives: Patient denies leg pain, rash, fever, numbness Disposition: '[]'$ ED /'[]'$ Urgent Care (no appt availability in office) / '[]'$ Appointment(In office/virtual)/ '[]'$  [place] [facility]/ '[]'$ Home Care/ '[x]'$ Refused Recommended Disposition /'[]'$  Mobile Bus/ '[]'$  Follow-up with PCP Additional Notes: Patient states he is sure this is gout- is requesting additional medication for flare- patient advised he may need appointment for this- he states he was just seen 2 weeks ago. Patient states he is using Allopurinol daily. [facility]

## 2022-08-31 ENCOUNTER — Ambulatory Visit: Payer: BC Managed Care – PPO | Admitting: Pulmonary Disease

## 2022-08-31 ENCOUNTER — Encounter: Payer: Self-pay | Admitting: Pulmonary Disease

## 2022-08-31 VITALS — BP 146/90 | HR 79 | Temp 97.6°F | Ht 69.0 in | Wt 197.6 lb

## 2022-08-31 DIAGNOSIS — F17210 Nicotine dependence, cigarettes, uncomplicated: Secondary | ICD-10-CM

## 2022-08-31 DIAGNOSIS — J4489 Other specified chronic obstructive pulmonary disease: Secondary | ICD-10-CM

## 2022-08-31 DIAGNOSIS — J019 Acute sinusitis, unspecified: Secondary | ICD-10-CM | POA: Diagnosis not present

## 2022-08-31 LAB — NITRIC OXIDE: Nitric Oxide: 22

## 2022-08-31 MED ORDER — BREZTRI AEROSPHERE 160-9-4.8 MCG/ACT IN AERO: 2.0000 | INHALATION_SPRAY | Freq: Two times a day (BID) | RESPIRATORY_TRACT | 0 refills | Status: DC

## 2022-08-31 MED ORDER — DOXYCYCLINE HYCLATE 100 MG PO TABS: 100.0000 mg | ORAL_TABLET | Freq: Two times a day (BID) | ORAL | 0 refills | Status: DC

## 2022-08-31 MED ORDER — ALBUTEROL SULFATE HFA 108 (90 BASE) MCG/ACT IN AERS: INHALATION_SPRAY | RESPIRATORY_TRACT | 6 refills | Status: DC

## 2022-08-31 MED ORDER — BREZTRI AEROSPHERE 160-9-4.8 MCG/ACT IN AERO: 2.0000 | INHALATION_SPRAY | Freq: Two times a day (BID) | RESPIRATORY_TRACT | 11 refills | Status: DC

## 2022-08-31 NOTE — Patient Instructions (Signed)
We are giving you a trial of an inhaler called Breztri this is 2 puffs twice a day.  Make sure you rinse your mouth well after you use it.  The new inhaler replaces Trelegy.  Do not use Trelegy anymore.  I sent in a prescription for an antibiotic that you will take twice a day for 7 days.  If you go outside in the sun please make sure that you have good sun protection as sometimes this antibiotic can cause an effect just like a sunburn.  We will see you in follow-up in 3 months time call sooner should any new problems arise.

## 2022-08-31 NOTE — Progress Notes (Deleted)
Subjective:    Patient ID: Derek [name], male    DOB: 1966/09/26, 56 y.o.   MRN: [id number] Patient Care Team: Birdie Sons, MD as PCP - General (Family Medicine) Tyler Pita, MD as Consulting Physician (Pulmonary Disease)  Chief Complaint  Patient presents with   Follow-up    COPD. SOB with exertion. Occasional wheezing. Cough with green sputum.    HPI Patient is a 56 year old current smoker   Review of Systems A 10 point review of systems was performed and it is as noted above otherwise negative.  Patient Active Problem List   Diagnosis Date Noted   GAD (generalized anxiety disorder) 08/01/2022   Hypertriglyceridemia 08/01/2022   Chest pain 08/01/2022   Left leg pain 08/01/2022   Hamstring muscle strain, right, initial encounter 05/02/2022   Aortic atherosclerosis ([place]) 02/24/2022   COPD (chronic obstructive pulmonary disease) ([place]) 12/16/2021   Asthma 05/16/2021   Adrenal mass ([place]) 01/03/2016   Cervical nerve root disorder 01/03/2016   Displacement of cervical intervertebral disc without myelopathy 01/03/2016   Erectile dysfunction 01/03/2016   Family history of malignant neoplasm of prostate 01/03/2016   H/O arthrodesis 01/03/2016   Gout 01/03/2016   Headache, migraine 01/03/2016   Calculus of kidney 01/03/2016   Backhand tennis elbow 01/03/2016   Disordered sleep 06/23/2014   B12 deficiency 06/23/2014   Degeneration of cervical intervertebral disc 01/25/2010   Lipoma of skin 07/21/2009   Primary hypertension 11/25/2007   Awareness of heartbeats 11/25/2007   Tobacco use 11/25/2007   Social History   Tobacco Use   Smoking status: Every Day    Packs/day: 0.50    Years: 36.00    Total pack years: 18.00    Types: Cigarettes   Smokeless tobacco: Current   Tobacco comments:    7-8cig daily--08/31/2022 khj  Substance Use Topics   Alcohol use: Yes    Comment: occasionally   No Known Allergies  Current Meds  Medication Sig   albuterol  (VENTOLIN HFA) 108 (90 Base) MCG/ACT inhaler TAKE 2 PUFFS BY MOUTH EVERY 6 HOURS AS NEEDED FOR WHEEZE OR SHORTNESS OF BREATH   allopurinol (ZYLOPRIM) 100 MG tablet TAKE 2 TABLETS (200 MG TOTAL) BY MOUTH ONCE DAILY.   amLODipine (NORVASC) 5 MG tablet Take 1 tablet (5 mg total) by mouth daily.   atenolol (TENORMIN) 50 MG tablet TAKE 1 TABLET BY MOUTH EVERY DAY   baclofen (LIORESAL) 10 MG tablet Take 1 tablet (10 mg total) by mouth 3 (three) times daily.   citalopram (CELEXA) 40 MG tablet TAKE 1 TABLET BY MOUTH EVERY DAY   colchicine 0.6 MG tablet Take 2 tablets first day, then one tablet daily for gout attack   Fluticasone-Umeclidin-Vilant (TRELEGY ELLIPTA) 100-62.5-25 MCG/ACT AEPB Inhale 1 puff into the lungs daily.   sildenafil (VIAGRA) 100 MG tablet Take 1 tablet (100 mg total) by mouth daily as needed for erectile dysfunction.   valsartan (DIOVAN) 160 MG tablet Take 1 tablet (160 mg total) by mouth daily.   Immunization History  Administered Date(s) Administered   Hepatitis B 11/07/2006, 12/10/2006   Influenza,inj,Quad PF,6+ Mos 08/19/2019, 08/01/2022   Influenza-Unspecified 08/13/2018, 08/01/2021   Janssen (J&J) SARS-COV-2 Vaccination 02/02/2020   Tdap 03/05/2009, 01/08/2018       Objective:   Physical Exam BP (!) 146/90 (BP Location: Left Arm, Cuff Size: Normal)   Pulse 79   Temp 97.6 F (36.4 C)   Ht '5\' 9"'$  (1.753 m)   Wt 197 lb 9.6 oz (89.6  kg)   SpO2 97%   BMI 29.18 kg/m  GENERAL: HEAD: Normocephalic, atraumatic.  EYES: Pupils equal, round, reactive to light.  No scleral icterus.  MOUTH: Poor dentition, oral mucosa moist, no thrush. NECK: Supple. No thyromegaly. Trachea midline. No JVD.  No adenopathy. PULMONARY: Good air entry bilaterally.  No adventitious sounds. CARDIOVASCULAR: S1 and S2. Regular rate and rhythm.  ABDOMEN: MUSCULOSKELETAL: No joint deformity, no clubbing, no edema.  NEUROLOGIC:  SKIN: Intact,warm,dry. PSYCH:  Lab Results  Component Value Date    NITRICOXIDE 22 08/31/2022        Assessment & Plan:

## 2022-08-31 NOTE — Progress Notes (Signed)
Subjective:    Patient ID: [name], male    DOB: [birth year], 56 y.o.   MRN: [id number] Patient Care Team: Birdie Sons, MD as PCP - General (Family Medicine) Tyler Pita, MD as Consulting Physician (Pulmonary Disease)  Chief Complaint  Patient presents with   Follow-up    COPD. SOB with exertion. Occasional wheezing. Cough with green sputum.    HPI Patient is a 56 year old current smoker (0.5 PPD,40 PY) who presents for follow-up on the issue of COPD and shortness of breath.  This is a scheduled visit.  Patient was initially seen by me on 03 May 2020, then he was evaluated by Rexene Edison, NP in February 2023.  He had been lost to follow-up between 2021- 2023.  At his prior visit in February he was noted to have an acute exacerbation of COPD he was treated for that and also referred to the lung cancer screening program.  Patient underwent lung cancer screening CT on 22 Feb 2022 and this was lung RADS 1, negative.  Was instructed to continue yearly CT.  Presents with his dyspnea at baseline though he does note that Trelegy has helped him significantly however he feels that the Trelegy does not last the whole day.  He has to use his albuterol inhaler at least twice to 3 times a day.  He has noted over the last 2 to 3 days that his cough has changed in character with sputum production now being green from white to yellow which is baseline.  No hemoptysis.  He has not had any chest pain, no orthopnea or paroxysmal nocturnal dyspnea.  No lower extremity edema.  No calf tenderness.  No fevers, chills or sweats.     DATA 05/11/2020 PFTs: Showed mild to moderate restriction with an FEV1 at 81%, or ratio 76, FVC 682%, no significant bronchodilator response, DLCO 69%. 02/22/2022 LDCT chest: Lung RADS 1, negative.   Review of Systems A 10 point review of systems was performed and it is as noted above otherwise negative.  Patient Active Problem List   Diagnosis Date Noted   GAD  (generalized anxiety disorder) 08/01/2022   Hypertriglyceridemia 08/01/2022   Chest pain 08/01/2022   Left leg pain 08/01/2022   Hamstring muscle strain, right, initial encounter 05/02/2022   Aortic atherosclerosis ([place]) 02/24/2022   COPD (chronic obstructive pulmonary disease) ([place]) 12/16/2021   Asthma 05/16/2021   Adrenal mass ([place]) 01/03/2016   Cervical nerve root disorder 01/03/2016   Displacement of cervical intervertebral disc without myelopathy 01/03/2016   Erectile dysfunction 01/03/2016   Family history of malignant neoplasm of prostate 01/03/2016   H/O arthrodesis 01/03/2016   Gout 01/03/2016   Headache, migraine 01/03/2016   Calculus of kidney 01/03/2016   Backhand tennis elbow 01/03/2016   Disordered sleep 06/23/2014   B12 deficiency 06/23/2014   Degeneration of cervical intervertebral disc 01/25/2010   Lipoma of skin 07/21/2009   Primary hypertension 11/25/2007   Awareness of heartbeats 11/25/2007   Tobacco use 11/25/2007   Social History   Tobacco Use   Smoking status: Every Day    Packs/day: 0.50    Years: 36.00    Total pack years: 18.00    Types: Cigarettes   Smokeless tobacco: Current   Tobacco comments:    7-8cig daily--08/31/2022 khj  Substance Use Topics   Alcohol use: Yes    Comment: occasionally   No Known Allergies  Current Meds  Medication Sig   allopurinol (ZYLOPRIM) 100 MG tablet TAKE  2 TABLETS (200 MG TOTAL) BY MOUTH ONCE DAILY.   amLODipine (NORVASC) 5 MG tablet Take 1 tablet (5 mg total) by mouth daily.   atenolol (TENORMIN) 50 MG tablet TAKE 1 TABLET BY MOUTH EVERY DAY   baclofen (LIORESAL) 10 MG tablet Take 1 tablet (10 mg total) by mouth 3 (three) times daily.   Budeson-Glycopyrrol-Formoterol (BREZTRI AEROSPHERE) 160-9-4.8 MCG/ACT AERO Inhale 2 puffs into the lungs in the morning and at bedtime.   Budeson-Glycopyrrol-Formoterol (BREZTRI AEROSPHERE) 160-9-4.8 MCG/ACT AERO Inhale 2 puffs into the lungs in the morning and at bedtime.    citalopram (CELEXA) 40 MG tablet TAKE 1 TABLET BY MOUTH EVERY DAY   colchicine 0.6 MG tablet Take 2 tablets first day, then one tablet daily for gout attack   doxycycline (VIBRA-TABS) 100 MG tablet Take 1 tablet (100 mg total) by mouth 2 (two) times daily. Wear sun protection if outside.   sildenafil (VIAGRA) 100 MG tablet Take 1 tablet (100 mg total) by mouth daily as needed for erectile dysfunction.   valsartan (DIOVAN) 160 MG tablet Take 1 tablet (160 mg total) by mouth daily.   [DISCONTINUED] albuterol (VENTOLIN HFA) 108 (90 Base) MCG/ACT inhaler TAKE 2 PUFFS BY MOUTH EVERY 6 HOURS AS NEEDED FOR WHEEZE OR SHORTNESS OF BREATH   [DISCONTINUED] Fluticasone-Umeclidin-Vilant (TRELEGY ELLIPTA) 100-62.5-25 MCG/ACT AEPB Inhale 1 puff into the lungs daily.   Immunization History  Administered Date(s) Administered   Hepatitis B 11/07/2006, 12/10/2006   Influenza,inj,Quad PF,6+ Mos 08/19/2019, 08/01/2022   Influenza-Unspecified 08/13/2018, 08/01/2021   Janssen (J&J) SARS-COV-2 Vaccination 02/02/2020   Tdap 03/05/2009, 01/08/2018       Objective:   Physical Exam BP (!) 146/90 (BP Location: Left Arm, Cuff Size: Normal)   Pulse 79   Temp 97.6 F (36.4 C)   Ht '5\' 9"'$  (1.753 m)   Wt 197 lb 9.6 oz (89.6 kg)   SpO2 97%   BMI 29.18 kg/m  GENERAL: Well-developed, well-nourished gentleman, no acute distress, fully ambulatory no conversational dyspnea. HEAD: Normocephalic, atraumatic.  EYES: Pupils equal, round, reactive to light.  No scleral icterus.  MOUTH: Poor dentition, oral mucosa moist, no thrush. NECK: Supple. No thyromegaly. Trachea midline. No JVD.  No adenopathy. PULMONARY: Good air entry bilaterally.  No adventitious sounds. CARDIOVASCULAR: S1 and S2. Regular rate and rhythm.  No rubs, murmurs or gallops heard. ABDOMEN: Benign. MUSCULOSKELETAL: No joint deformity, no clubbing, no edema.  NEUROLOGIC: No overt focal deficit, no gait disturbance, speech is fluent. SKIN:  Intact,warm,dry. PSYCH: Mood and behavior normal.  Lab Results  Component Value Date   NITRICOXIDE 22 08/31/2022        Assessment & Plan:     ICD-10-CM   1. Asthma-COPD overlap syndrome  J44.89 Nitric oxide   Trelegy not as effective Trial of Breztri 2 puffs twice a day Discontinue Trelegy    2. Acute rhinosinusitis  J01.90 albuterol (VENTOLIN HFA) 108 (90 Base) MCG/ACT inhaler   Doxycycline 100 mg twice daily Patient instructed in regards to sun protection while on the medication    3. Tobacco dependence due to cigarettes  F17.210    Patient counseled regards to discontinuation of smoking Counseling time 3 to 5 minutes     Orders Placed This Encounter  Procedures   Nitric oxide   Meds ordered this encounter  Medications   albuterol (VENTOLIN HFA) 108 (90 Base) MCG/ACT inhaler    Sig: TAKE 2 PUFFS BY MOUTH EVERY 6 HOURS AS NEEDED FOR WHEEZE OR SHORTNESS OF BREATH  Dispense:  18 each    Refill:  6   Budeson-Glycopyrrol-Formoterol (BREZTRI AEROSPHERE) 160-9-4.8 MCG/ACT AERO    Sig: Inhale 2 puffs into the lungs in the morning and at bedtime.    Dispense:  59 g    Refill:  0    Order Specific Question:   Lot Number?    Answer:   [id number]    Order Specific Question:   Expiration Date?    Answer:   01/21/2025    Order Specific Question:   Manufacturer?    Answer:   [employer] [[id number]]    Order Specific Question:   Quantity    Answer:   1   doxycycline (VIBRA-TABS) 100 MG tablet    Sig: Take 1 tablet (100 mg total) by mouth 2 (two) times daily. Wear sun protection if outside.    Dispense:  14 tablet    Refill:  0   Budeson-Glycopyrrol-Formoterol (BREZTRI AEROSPHERE) 160-9-4.8 MCG/ACT AERO    Sig: Inhale 2 puffs into the lungs in the morning and at bedtime.    Dispense:  10.7 g    Refill:  11   Patient has a acute rhinosinusitis.  We will treat with doxycycline for 7 days.  We will give the patient a trial of Breztri 2 puffs twice a day discontinuing Trelegy as he  feels that the Trelegy does not last him the whole day.  Patient was counseled regards discontinuation of smoking.  We will see the patient in follow-up in 3 months time he is to contact us prior to that time should any new difficulties arise.  Derek Don, MD Advanced Bronchoscopy PCCM [facility]    *This note was dictated using voice recognition software/Dragon.  Despite best efforts to proofread, errors can occur which can change the meaning. Any transcriptional errors that result from this process are unintentional and may not be fully corrected at the time of dictation.

## 2022-08-31 NOTE — Progress Notes (Deleted)
   Subjective:    Patient ID: Derek [name], male    DOB: November 28, 1965, 56 y.o.   MRN: [id number]  HPI    Review of Systems     Objective:   Physical Exam        Assessment & Plan:

## 2022-09-06 ENCOUNTER — Other Ambulatory Visit: Payer: Self-pay | Admitting: Family Medicine

## 2022-09-08 ENCOUNTER — Encounter: Payer: Self-pay | Admitting: Family Medicine

## 2022-09-08 ENCOUNTER — Ambulatory Visit (INDEPENDENT_AMBULATORY_CARE_PROVIDER_SITE_OTHER): Payer: BC Managed Care – PPO | Admitting: Family Medicine

## 2022-09-08 VITALS — BP 142/93 | HR 86 | Temp 98.0°F | Resp 16 | Wt 197.0 lb

## 2022-09-08 DIAGNOSIS — E790 Hyperuricemia without signs of inflammatory arthritis and tophaceous disease: Secondary | ICD-10-CM | POA: Diagnosis not present

## 2022-09-08 DIAGNOSIS — I10 Essential (primary) hypertension: Secondary | ICD-10-CM | POA: Diagnosis not present

## 2022-09-08 DIAGNOSIS — I700 Atherosclerosis of aorta: Secondary | ICD-10-CM

## 2022-09-08 DIAGNOSIS — F1010 Alcohol abuse, uncomplicated: Secondary | ICD-10-CM

## 2022-09-08 DIAGNOSIS — M109 Gout, unspecified: Secondary | ICD-10-CM

## 2022-09-08 MED ORDER — NALTREXONE HCL 50 MG PO TABS: 50.0000 mg | ORAL_TABLET | Freq: Every day | ORAL | 3 refills | Status: DC

## 2022-09-08 NOTE — Progress Notes (Signed)
I,Roshena L Chambers,acting as a scribe for Lelon Huh, MD.,have documented all relevant documentation on the behalf of Lelon Huh, MD,as directed by  Lelon Huh, MD while in the presence of Lelon Huh, MD.    Established patient visit   Patient: Derek [name]   DOB: [birth year]   56 y.o. Male  MRN: [id number] Visit Date: 09/08/2022  Today's healthcare provider: Lelon Huh, MD   Chief Complaint  Patient presents with   Hypertension   Subjective    HPI  Hypertension, follow-up  BP Readings from Last 3 Encounters:  09/08/22 (!) 142/93  08/31/22 (!) 146/90  08/01/22 134/88   Wt Readings from Last 3 Encounters:  09/08/22 197 lb (89.4 kg)  08/31/22 197 lb 9.6 oz (89.6 kg)  08/01/22 195 lb (88.5 kg)     He was last seen for hypertension on 08/01/2022 (seen by Mikey Kirschner, PA-C).   BP at that visit was 134/88. Management since that visit includes changing from valsartan hctz to just valsartan due to worsened kidney functions, high uric acid, and frequent gout. Also added amlodipine 5 mg. He also stopped atenolol since he didn't know if he was supposed to take it with the new medication.   He reports poor compliance with treatment. Patient has not taken Atenolol since the change in medications He is not having side effects.  He is following a Regular diet. He is not exercising. He does smoke.  Use of agents associated with hypertension: none.   Outside blood pressures are not checked. Symptoms: No chest pain No chest pressure  Yes palpitations No syncope  No dyspnea No orthopnea  No paroxysmal nocturnal dyspnea No lower extremity edema   Pertinent labs Lab Results  Component Value Date   CHOL 161 08/07/2022   HDL 23 (L) 08/07/2022   LDLCALC 41 08/07/2022   TRIG 697 (HH) 08/07/2022   CHOLHDL 7.0 (H) 08/07/2022   Lab Results  Component Value Date   NA 137 08/07/2022   K 4.4 08/07/2022   CREATININE 1.48 (H) 08/07/2022   EGFR 55 (L) 08/07/2022    GLUCOSE 95 08/07/2022   TSH 1.120 02/14/2019     Lab Results  Component Value Date   LABURIC 11.7 (H) 08/07/2022    The 10-year ASCVD risk score (Arnett DK, et al., 2019) is: 22.4%  ---------------------------------------------------------------------------------------------------  He also states his kids have told him that he drinks too much alcohol and was wondering if there is something that would help him stop and reduce alcohol consumption. He typically drinks a large glass of [employer] when he gets home from work at night.    Medications: Outpatient Medications Prior to Visit  Medication Sig   albuterol (VENTOLIN HFA) 108 (90 Base) MCG/ACT inhaler TAKE 2 PUFFS BY MOUTH EVERY 6 HOURS AS NEEDED FOR WHEEZE OR SHORTNESS OF BREATH   allopurinol (ZYLOPRIM) 100 MG tablet TAKE 2 TABLETS (200 MG TOTAL) BY MOUTH ONCE DAILY.   amLODipine (NORVASC) 5 MG tablet Take 1 tablet (5 mg total) by mouth daily.   baclofen (LIORESAL) 10 MG tablet Take 1 tablet (10 mg total) by mouth 3 (three) times daily.   Budeson-Glycopyrrol-Formoterol (BREZTRI AEROSPHERE) 160-9-4.8 MCG/ACT AERO Inhale 2 puffs into the lungs in the morning and at bedtime.   Budeson-Glycopyrrol-Formoterol (BREZTRI AEROSPHERE) 160-9-4.8 MCG/ACT AERO Inhale 2 puffs into the lungs in the morning and at bedtime.   citalopram (CELEXA) 40 MG tablet TAKE 1 TABLET BY MOUTH EVERY DAY   colchicine 0.6 MG  tablet TAKE 2 TABLETS FIRST DAY, THEN ONE TABLET DAILY FOR GOUT ATTACK   doxycycline (VIBRA-TABS) 100 MG tablet Take 1 tablet (100 mg total) by mouth 2 (two) times daily. Wear sun protection if outside.   sildenafil (VIAGRA) 100 MG tablet Take 1 tablet (100 mg total) by mouth daily as needed for erectile dysfunction.   valsartan (DIOVAN) 160 MG tablet Take 1 tablet (160 mg total) by mouth daily.   atenolol (TENORMIN) 50 MG tablet TAKE 1 TABLET BY MOUTH EVERY DAY (Patient not taking: Reported on 09/08/2022)   No facility-administered  medications prior to visit.    Review of Systems  Constitutional:  Negative for appetite change, chills and fever.  Respiratory:  Negative for chest tightness, shortness of breath and wheezing.   Cardiovascular:  Negative for chest pain and palpitations.  Gastrointestinal:  Negative for abdominal pain, nausea and vomiting.       Objective    BP (!) 142/93 (BP Location: Left Arm, Patient Position: Sitting)   Pulse 86   Temp 98 F (36.7 C) (Oral)   Resp 16   Wt 197 lb (89.4 kg)   SpO2 99% Comment: room air  BMI 29.09 kg/m    Today's Vitals   09/08/22 1359 09/08/22 1405  BP: (!) 151/94 (!) 142/93  Pulse: 82 86  Resp: 16   Temp: 98 F (36.7 C)   TempSrc: Oral   SpO2: 99%   Weight: 197 lb (89.4 kg)    Body mass index is 29.09 kg/m.   Physical Exam   General appearance: Well developed, well nourished male, cooperative and in no acute distress Head: Normocephalic, without obvious abnormality, atraumatic Respiratory: Respirations even and unlabored, normal respiratory rate Extremities: All extremities are intact.  Skin: Skin color, texture, turgor normal. No rashes seen  Psych: Appropriate mood and affect. Neurologic: Mental status: Alert, oriented to person, place, and time, thought content appropriate.   Assessment & Plan     1. Primary hypertension Doing well with change from valsartan-hctz to valsartan 80 and amlodipine 5. Has stopped amlodipine since he didn't know that he was supposed to continue it. Advised to resume atenolol.   - Renal function panel  2. Hyperuricemia Now off of hctz. On 242m allopurinol.  - Uric acid  3. Gout, unspecified cause, unspecified chronicity, unspecified site Colchicine remains effective. Counseled to avoid alcohol.   4. Excessive drinking of alcohol He does not feel depressed at all, but is on citalopram for anxiety.  Try naltrexone (DEPADE) 50 MG tablet; Take 1 tablet (50 mg total) by mouth daily.  Dispense: 30 tablet;  Refill: 3      The entirety of the information documented in the History of Present Illness, Review of Systems and Physical Exam were personally obtained by me. Portions of this information were initially documented by the CMA and reviewed by me for thoroughness and accuracy.     DLelon Huh MD  [phone](phone) [phone](fax)  [place]

## 2022-09-09 LAB — RENAL FUNCTION PANEL
Albumin: 4.5 g/dL (ref 3.8–4.9)
BUN/Creatinine Ratio: 15 (ref 9–20)
BUN: 22 mg/dL (ref 6–24)
CO2: 22 mmol/L (ref 20–29)
Calcium: 9.8 mg/dL (ref 8.7–10.2)
Chloride: 102 mmol/L (ref 96–106)
Creatinine, Ser: 1.44 mg/dL — ABNORMAL HIGH (ref 0.76–1.27)
Glucose: 98 mg/dL (ref 70–99)
Phosphorus: 3.6 mg/dL (ref 2.8–4.1)
Potassium: 4.3 mmol/L (ref 3.5–5.2)
Sodium: 138 mmol/L (ref 134–144)
eGFR: 57 mL/min/{1.73_m2} — ABNORMAL LOW (ref >59)

## 2022-09-09 LAB — URIC ACID: Uric Acid: 7 mg/dL (ref 3.8–8.4)

## 2022-09-17 ENCOUNTER — Other Ambulatory Visit: Payer: Self-pay | Admitting: Family Medicine

## 2022-09-17 DIAGNOSIS — I10 Essential (primary) hypertension: Secondary | ICD-10-CM

## 2022-09-25 ENCOUNTER — Encounter: Payer: Self-pay | Admitting: Cardiology

## 2022-09-25 ENCOUNTER — Other Ambulatory Visit
Admission: RE | Admit: 2022-09-25 | Discharge: 2022-09-25 | Disposition: A | Discharge status: Home / Self Care | Payer: BC Managed Care – PPO | Source: Ambulatory Visit | Attending: Cardiology | Admitting: Cardiology

## 2022-09-25 ENCOUNTER — Ambulatory Visit: Payer: BC Managed Care – PPO | Attending: Cardiology | Admitting: Cardiology

## 2022-09-25 VITALS — BP 162/102 | HR 58 | Ht 70.0 in | Wt 197.4 lb

## 2022-09-25 DIAGNOSIS — I10 Essential (primary) hypertension: Secondary | ICD-10-CM | POA: Insufficient documentation

## 2022-09-25 DIAGNOSIS — R079 Chest pain, unspecified: Secondary | ICD-10-CM | POA: Diagnosis not present

## 2022-09-25 DIAGNOSIS — R072 Precordial pain: Secondary | ICD-10-CM

## 2022-09-25 DIAGNOSIS — F17200 Nicotine dependence, unspecified, uncomplicated: Secondary | ICD-10-CM

## 2022-09-25 DIAGNOSIS — E781 Pure hyperglyceridemia: Secondary | ICD-10-CM

## 2022-09-25 LAB — BASIC METABOLIC PANEL
Anion gap: 6 (ref 5–15)
BUN: 23 mg/dL — ABNORMAL HIGH (ref 6–20)
CO2: 25 mmol/L (ref 22–32)
Calcium: 9.3 mg/dL (ref 8.9–10.3)
Chloride: 107 mmol/L (ref 98–111)
Creatinine, Ser: 1.23 mg/dL (ref 0.61–1.24)
GFR, Estimated: >60 mL/min (ref >60)
Glucose, Bld: 105 mg/dL — ABNORMAL HIGH (ref 70–99)
Potassium: 4.1 mmol/L (ref 3.5–5.1)
Sodium: 138 mmol/L (ref 135–145)

## 2022-09-25 MED ORDER — AMLODIPINE BESYLATE 5 MG PO TABS: 5.0000 mg | ORAL_TABLET | Freq: Every day | ORAL | 1 refills | Status: DC

## 2022-09-25 MED ORDER — OMEPRAZOLE MAGNESIUM 20 MG PO TBEC: 20.0000 mg | DELAYED_RELEASE_TABLET | Freq: Every day | ORAL | 0 refills | Status: DC

## 2022-09-25 MED ORDER — FENOFIBRATE 145 MG PO TABS: 145.0000 mg | ORAL_TABLET | Freq: Every day | ORAL | 0 refills | Status: DC

## 2022-09-25 NOTE — Progress Notes (Signed)
Cardiology Office Note:    Date:  09/25/2022   ID:  Derek [name], DOB 01-05-1966, MRN [id number]  PCP:  Birdie Sons, MD   [facility] Cardiologist:  Kate Sable, MD     Referring MD: Birdie Sons, MD   Chief Complaint  Patient presents with   New Patient (Initial Visit)    Chest pain, left arm pain    Derek [name] is a 56 y.o. male who is being seen today for the evaluation of chest pain at the request of Fisher, Kirstie Peri, MD.   History of Present Illness:    [name] is a 56 y.o. male with a hx of hypertension, elevated triglycerides, current smoker 30+ years, who presents with chest pain.  Complains of ongoing chest pain over the past year.  Symptoms are not associated with exertion.  He thinks symptoms might be related to reflux symptoms burping occasionally radiates pain.  Denies any personal history of heart disease.  Denies edema, palpitations.  Has been on blood pressure medicines for some time now, not sure of current doses, not sure if he is taking amlodipine or not.  Past Medical History:  Diagnosis Date   Gout    Hypertension     Past Surgical History:  Procedure Laterality Date   CERVICAL DISC SURGERY     COLONOSCOPY WITH PROPOFOL N/A 02/18/2018   Procedure: COLONOSCOPY WITH PROPOFOL;  Surgeon: Lin Landsman, MD;  Location: [facility] ENDOSCOPY;  Service: Gastroenterology;  Laterality: N/A;   TONSILLECTOMY AND ADENOIDECTOMY      Current Medications: Current Meds  Medication Sig   albuterol (VENTOLIN HFA) 108 (90 Base) MCG/ACT inhaler TAKE 2 PUFFS BY MOUTH EVERY 6 HOURS AS NEEDED FOR WHEEZE OR SHORTNESS OF BREATH   allopurinol (ZYLOPRIM) 100 MG tablet TAKE 2 TABLETS (200 MG TOTAL) BY MOUTH ONCE DAILY.   atenolol (TENORMIN) 50 MG tablet TAKE 1 TABLET BY MOUTH EVERY DAY   Budeson-Glycopyrrol-Formoterol (BREZTRI AEROSPHERE) 160-9-4.8 MCG/ACT AERO Inhale 2 puffs into the lungs in the morning and at bedtime.   citalopram  (CELEXA) 40 MG tablet TAKE 1 TABLET BY MOUTH EVERY DAY   colchicine 0.6 MG tablet TAKE 2 TABLETS FIRST DAY, THEN ONE TABLET DAILY FOR GOUT ATTACK   fenofibrate (TRICOR) 145 MG tablet Take 1 tablet (145 mg total) by mouth daily.   naltrexone (DEPADE) 50 MG tablet Take 1 tablet (50 mg total) by mouth daily.   omeprazole (PRILOSEC OTC) 20 MG tablet Take 1 tablet (20 mg total) by mouth daily for 14 days.   sildenafil (VIAGRA) 100 MG tablet Take 1 tablet (100 mg total) by mouth daily as needed for erectile dysfunction.   valsartan (DIOVAN) 160 MG tablet Take 1 tablet (160 mg total) by mouth daily.   [DISCONTINUED] amLODipine (NORVASC) 5 MG tablet Take 1 tablet (5 mg total) by mouth daily.     Allergies:   Patient has no known allergies.   Social History   Socioeconomic History   Marital status: Divorced    Spouse name: Not on file   Number of children: Not on file   Years of education: Not on file   Highest education level: Not on file  Occupational History   Not on file  Tobacco Use   Smoking status: Every Day    Packs/day: 0.50    Years: 36.00    Total pack years: 18.00    Types: Cigarettes   Smokeless tobacco: Current    Types:  Snuff   Tobacco comments:    7-8cig daily--08/31/2022 khj  Substance and Sexual Activity   Alcohol use: Yes    Comment: occasionally   Drug use: No   Sexual activity: Not on file  Other Topics Concern   Not on file  Social History Narrative   Not on file   Social Determinants of Health   Financial Resource Strain: Not on file  Food Insecurity: Not on file  Transportation Needs: Not on file  Physical Activity: Not on file  Stress: Not on file  Social Connections: Not on file     Family History: The patient's family history includes Fibromyalgia in his mother; Healthy in his sister; Hypertension in his father; Prostate cancer in his father.  ROS:   Please see the history of present illness.     All other systems reviewed and are  negative.  EKGs/Labs/Other Studies Reviewed:    The following studies were reviewed today:   EKG:  EKG is  ordered today.  The ekg ordered today demonstrates sinus bradycardia, heart rate 58  Recent Labs: 08/07/2022: ALT 36; Hemoglobin 13.9; Platelets 223 09/08/2022: BUN 22; Creatinine, Ser 1.44; Potassium 4.3; Sodium 138  Recent Lipid Panel    Component Value Date/Time   CHOL 161 08/07/2022 0839   TRIG 697 (HH) 08/07/2022 0839   HDL 23 (L) 08/07/2022 0839   CHOLHDL 7.0 (H) 08/07/2022 0839   LDLCALC 41 08/07/2022 0839     Risk Assessment/Calculations:     HYPERTENSION CONTROL Vitals:   09/25/22 1400 09/25/22 1408  BP: (!) 162/104 (!) 162/102    The patient's blood pressure is elevated above target today.  In order to address the patient's elevated BP: A current anti-hypertensive medication was adjusted today.            Physical Exam:    VS:  BP (!) 162/102 (BP Location: Right Arm)   Pulse (!) 58   Ht '5\' 10"'$  (1.778 m)   Wt 197 lb 6.4 oz (89.5 kg)   SpO2 (!) 58%   BMI 28.32 kg/m     Wt Readings from Last 3 Encounters:  09/25/22 197 lb 6.4 oz (89.5 kg)  09/08/22 197 lb (89.4 kg)  08/31/22 197 lb 9.6 oz (89.6 kg)     GEN:  Well nourished, well developed in no acute distress HEENT: Normal NECK: No JVD; No carotid bruits CARDIAC: RRR, no murmurs, rubs, gallops RESPIRATORY:  Clear to auscultation without rales, wheezing or rhonchi  ABDOMEN: Soft, non-tender, non-distended MUSCULOSKELETAL:  No edema; No deformity  SKIN: Warm and dry NEUROLOGIC:  Alert and oriented x 3 PSYCHIATRIC:  Normal affect   ASSESSMENT:    1. Precordial pain   2. Primary hypertension   3. Pure hypertriglyceridemia   4. Smoking   5. Chest pain, unspecified type    PLAN:    In order of problems listed above:  Chest pain, risk factors hypertension, smoking, elevated triglycerides.  Get echo, get coronary CTA to evaluate CAD.  Start Prilosec 20 mg daily due to history of  heartburn. Hypertension, BP elevated.  Advised to take amlodipine 5 mg daily as prescribed.  Continue valsartan, atenolol.  Titrate BP meds for adequate control.  Low-salt diet advised.  Consider switching atenolol to Coreg if BP stays elevated. Elevated triglycerides, start fenofibrate. Current smoker, smoking cessation advised.  Follow-up after echo and coronary CTA.      Medication Adjustments/Labs and Tests Ordered: Current medicines are reviewed at length with the patient today.  Concerns regarding medicines are outlined above.  Orders Placed This Encounter  Procedures   CT CORONARY MORPH W/CTA COR W/SCORE W/CA W/CM &/OR WO/CM   Basic Metabolic Panel (BMET)   EKG 12-Lead   ECHOCARDIOGRAM COMPLETE   Meds ordered this encounter  Medications   fenofibrate (TRICOR) 145 MG tablet    Sig: Take 1 tablet (145 mg total) by mouth daily.    Dispense:  90 tablet    Refill:  0   omeprazole (PRILOSEC OTC) 20 MG tablet    Sig: Take 1 tablet (20 mg total) by mouth daily for 14 days.    Dispense:  14 tablet    Refill:  0   amLODipine (NORVASC) 5 MG tablet    Sig: Take 1 tablet (5 mg total) by mouth daily.    Dispense:  90 tablet    Refill:  1    Patient Instructions  Medication Instructions:   START Fenofibrate - take one tablet ('145mg'$ ) by mouth daily.  START Prilosec - Take one tablet ('20mg'$ ) by mouth daily for 2 weeks.   *If you need a refill on your cardiac medications before your next appointment, please call your pharmacy*   Lab Work:  Your physician recommends you go to the medical mall to have lab work completed - BMP  If you have labs (blood work) drawn today and your tests are completely normal, you will receive your results only by: MyChart Message (if you have MyChart) OR A paper copy in the mail If you have any lab test that is abnormal or we need to change your treatment, we will call you to review the results.   Testing/Procedures:    Your cardiac CT will be  scheduled at:  [facility] [address] [place], [place] [address] [phone]   please arrive 15 mins early for check-in and test prep.   Please follow these instructions carefully (unless otherwise directed):  Hold all erectile dysfunction medications at least 3 days (72 hrs) prior to test. (Ie viagra, cialis, sildenafil, tadalafil, etc) We will administer nitroglycerin during this exam.   On the Night Before the Test: Be sure to Drink plenty of water. Do not consume any caffeinated/decaffeinated beverages or chocolate 12 hours prior to your test. Do not take any antihistamines 12 hours prior to your test.  On the Day of the Test: Drink plenty of water until 1 hour prior to the test. Do not eat any food 1 hour prior to test. You may take your regular medications prior to the test.       After the Test: Drink plenty of water. After receiving IV contrast, you may experience a mild flushed feeling. This is normal. On occasion, you may experience a mild rash up to 24 hours after the test. This is not dangerous. If this occurs, you can take Benadryl 25 mg and increase your fluid intake. If you experience trouble breathing, this can be serious. If it is severe call 911 IMMEDIATELY. If it is mild, please call our office. If you take any of these medications: Glipizide/Metformin, Avandament, Glucavance, please do not take 48 hours after completing test unless otherwise instructed.  We will call to schedule your test 2-4 weeks out understanding that some insurance companies will need an authorization prior to the service being performed.   For non-scheduling related questions, please contact the cardiac imaging nurse navigator should you have any questions/concerns: Marchia Bond, Cardiac Imaging Nurse Navigator Gordy Clement, Cardiac  Imaging Nurse [facility] Direct Office Dial: [phone]   For  scheduling needs, including cancellations and rescheduling, please call [place], [phone].  2. Echocardiogram   Your physician has requested that you have an echocardiogram. Echocardiography is a painless test that uses sound waves to create images of your heart. It provides your doctor with information about the size and shape of your heart and how well your heart's chambers and valves are working. This procedure takes approximately one hour. There are no restrictions for this procedure. Please note; depending on visual quality an IV may need to be placed.      Follow-Up: At [facility], you and your health needs are our priority.  As part of our continuing mission to provide you with exceptional heart care, we have created designated Provider Care Teams.  These Care Teams include your primary Cardiologist (physician) and Advanced Practice Providers (APPs -  Physician Assistants and Nurse Practitioners) who all work together to provide you with the care you need, when you need it.  We recommend signing up for the patient portal called "MyChart".  Sign up information is provided on this After Visit Summary.  MyChart is used to connect with patients for Virtual Visits (Telemedicine).  Patients are able to view lab/test results, encounter notes, upcoming appointments, etc.  Non-urgent messages can be sent to your provider as well.   To learn more about what you can do with MyChart, go to [email or web address].    Your next appointment:   6 - 8 week(s)  The format for your next appointment:   In Person  Provider:   You may see Kate Sable, MD or one of the following Advanced Practice Providers on your designated Care Team:   Murray Hodgkins, NP Christell Faith, PA-C Cadence Kathlen Mody, PA-C Gerrie Nordmann, NP      Signed, Kate Sable, MD  09/25/2022 3:04 PM    [place]

## 2022-09-25 NOTE — Patient Instructions (Addendum)
Medication Instructions:   START Fenofibrate - take one tablet ('145mg'$ ) by mouth daily.  START Prilosec - Take one tablet ('20mg'$ ) by mouth daily for 2 weeks.   *If you need a refill on your cardiac medications before your next appointment, please call your pharmacy*   Lab Work:  Your physician recommends you go to the medical mall to have lab work completed - BMP  If you have labs (blood work) drawn today and your tests are completely normal, you will receive your results only by: MyChart Message (if you have MyChart) OR A paper copy in the mail If you have any lab test that is abnormal or we need to change your treatment, we will call you to review the results.   Testing/Procedures:    Your cardiac CT will be scheduled at:  [facility] [address] [place], [place] [address] [phone]   please arrive 15 mins early for check-in and test prep.   Please follow these instructions carefully (unless otherwise directed):  Hold all erectile dysfunction medications at least 3 days (72 hrs) prior to test. (Ie viagra, cialis, sildenafil, tadalafil, etc) We will administer nitroglycerin during this exam.   On the Night Before the Test: Be sure to Drink plenty of water. Do not consume any caffeinated/decaffeinated beverages or chocolate 12 hours prior to your test. Do not take any antihistamines 12 hours prior to your test.  On the Day of the Test: Drink plenty of water until 1 hour prior to the test. Do not eat any food 1 hour prior to test. You may take your regular medications prior to the test.       After the Test: Drink plenty of water. After receiving IV contrast, you may experience a mild flushed feeling. This is normal. On occasion, you may experience a mild rash up to 24 hours after the test. This is not dangerous. If this occurs, you can take Benadryl 25 mg and increase your fluid intake. If you experience trouble  breathing, this can be serious. If it is severe call 911 IMMEDIATELY. If it is mild, please call our office. If you take any of these medications: Glipizide/Metformin, Avandament, Glucavance, please do not take 48 hours after completing test unless otherwise instructed.  We will call to schedule your test 2-4 weeks out understanding that some insurance companies will need an authorization prior to the service being performed.   For non-scheduling related questions, please contact the cardiac imaging nurse navigator should you have any questions/concerns: Marchia Bond, Cardiac Imaging Nurse Navigator Gordy Clement, Cardiac Imaging Nurse Navigator [facility] Direct Office Dial: [phone]   For scheduling needs, including cancellations and rescheduling, please call [place], [phone].  2. Echocardiogram   Your physician has requested that you have an echocardiogram. Echocardiography is a painless test that uses sound waves to create images of your heart. It provides your doctor with information about the size and shape of your heart and how well your heart's chambers and valves are working. This procedure takes approximately one hour. There are no restrictions for this procedure. Please note; depending on visual quality an IV may need to be placed.      Follow-Up: At [facility], you and your health needs are our priority.  As part of our continuing mission to provide you with exceptional heart care, we have created designated Provider Care Teams.  These Care Teams include your primary Cardiologist (physician) and Advanced Practice  Providers (APPs -  Physician Assistants and Nurse Practitioners) who all work together to provide you with the care you need, when you need it.  We recommend signing up for the patient portal called "MyChart".  Sign up information is provided on this After Visit Summary.  MyChart is used to connect with patients for Virtual  Visits (Telemedicine).  Patients are able to view lab/test results, encounter notes, upcoming appointments, etc.  Non-urgent messages can be sent to your provider as well.   To learn more about what you can do with MyChart, go to [email or web address].    Your next appointment:   6 - 8 week(s)  The format for your next appointment:   In Person  Provider:   You may see Kate Sable, MD or one of the following Advanced Practice Providers on your designated Care Team:   Murray Hodgkins, NP Christell Faith, PA-C Cadence Kathlen Mody, PA-C Gerrie Nordmann, NP

## 2022-09-27 ENCOUNTER — Ambulatory Visit (INDEPENDENT_AMBULATORY_CARE_PROVIDER_SITE_OTHER): Payer: Self-pay | Admitting: Nurse Practitioner

## 2022-09-27 ENCOUNTER — Encounter: Payer: Self-pay | Admitting: Nurse Practitioner

## 2022-09-27 VITALS — BP 138/90 | HR 68 | Temp 97.8°F | Ht 70.0 in | Wt 198.6 lb

## 2022-09-27 DIAGNOSIS — J0110 Acute frontal sinusitis, unspecified: Secondary | ICD-10-CM

## 2022-09-27 MED ORDER — FLUTICASONE PROPIONATE 50 MCG/ACT NA SUSP: 2.0000 | Freq: Every day | NASAL | 6 refills | Status: DC

## 2022-09-27 MED ORDER — AMOXICILLIN-POT CLAVULANATE 875-125 MG PO TABS: 1.0000 | ORAL_TABLET | Freq: Two times a day (BID) | ORAL | 0 refills | Status: DC

## 2022-09-27 NOTE — Progress Notes (Signed)
[facility] [address] [place], [place] [address] [phone]  Office Visit Note  Patient Name: Derek [name] Date of Birth [id number]  Medical Record number [id number]  Date of Service: 09/27/2022  Chief Complaint  Patient presents with   Sinusitis    Sinus infection? Started 2 weeks ago. Headache and toothache. Mucus is light green. Coughing. No ST or fever     HPI 56 year old male presenting with 2 weeks of sinus congestion and a cough  History of asthma Uses daily inhaler and rescue inhaler  He has had more wheezing with illness   Has not been taking anything OTC    Current Medication:  Outpatient Encounter Medications as of 09/27/2022  Medication Sig   albuterol (VENTOLIN HFA) 108 (90 Base) MCG/ACT inhaler TAKE 2 PUFFS BY MOUTH EVERY 6 HOURS AS NEEDED FOR WHEEZE OR SHORTNESS OF BREATH   allopurinol (ZYLOPRIM) 100 MG tablet TAKE 2 TABLETS (200 MG TOTAL) BY MOUTH ONCE DAILY.   amLODipine (NORVASC) 5 MG tablet Take 1 tablet (5 mg total) by mouth daily.   atenolol (TENORMIN) 50 MG tablet TAKE 1 TABLET BY MOUTH EVERY DAY   Budeson-Glycopyrrol-Formoterol (BREZTRI AEROSPHERE) 160-9-4.8 MCG/ACT AERO Inhale 2 puffs into the lungs in the morning and at bedtime.   citalopram (CELEXA) 40 MG tablet TAKE 1 TABLET BY MOUTH EVERY DAY   colchicine 0.6 MG tablet TAKE 2 TABLETS FIRST DAY, THEN ONE TABLET DAILY FOR GOUT ATTACK   omeprazole (PRILOSEC OTC) 20 MG tablet Take 1 tablet (20 mg total) by mouth daily for 14 days.   valsartan (DIOVAN) 160 MG tablet Take 1 tablet (160 mg total) by mouth daily.   baclofen (LIORESAL) 10 MG tablet Take 1 tablet (10 mg total) by mouth 3 (three) times daily. (Patient not taking: Reported on 09/25/2022)   doxycycline (VIBRA-TABS) 100 MG tablet Take 1 tablet (100 mg total) by mouth 2 (two) times daily. Wear sun protection if outside. (Patient not taking: Reported on 09/25/2022)   fenofibrate (TRICOR) 145 MG tablet Take 1 tablet (145 mg total) by mouth  daily.   naltrexone (DEPADE) 50 MG tablet Take 1 tablet (50 mg total) by mouth daily.   sildenafil (VIAGRA) 100 MG tablet Take 1 tablet (100 mg total) by mouth daily as needed for erectile dysfunction.   No facility-administered encounter medications on file as of 09/27/2022.      Medical History: Past Medical History:  Diagnosis Date   Gout    Hypertension      Vital Signs: BP (!) 138/90 (BP Location: Left Arm, Patient Position: Sitting, Cuff Size: Normal)   Pulse 68   Temp 97.8 F (36.6 C) (Tympanic)   Ht '5\' 10"'$  (1.778 m)   Wt 198 lb 9.6 oz (90.1 kg)   SpO2 99%   BMI 28.50 kg/m    Review of Systems  Constitutional: Negative.   HENT:  Positive for congestion, ear pain, rhinorrhea, sinus pressure and sinus pain.   Respiratory:  Positive for cough and wheezing.   Cardiovascular: Negative.   Gastrointestinal: Negative.   Genitourinary: Negative.   Musculoskeletal: Negative.   Neurological:  Positive for headaches.    Physical Exam Constitutional:      Appearance: Normal appearance.  HENT:     Head: Normocephalic.     Right Ear: Tympanic membrane, ear canal and external ear normal.     Left Ear: Tympanic membrane, ear canal and external ear normal.     Nose: Congestion present.  Mouth/Throat:     Pharynx: Posterior oropharyngeal erythema present.  Eyes:     Pupils: Pupils are equal, round, and reactive to light.  Cardiovascular:     Rate and Rhythm: Normal rate and regular rhythm.     Heart sounds: Normal heart sounds.  Pulmonary:     Effort: Pulmonary effort is normal.     Breath sounds: Normal breath sounds.  Skin:    General: Skin is warm.  Neurological:     General: No focal deficit present.     Mental Status: He is alert.  Psychiatric:        Mood and Affect: Mood normal.       Assessment/Plan: 1. Acute non-recurrent frontal sinusitis  - amoxicillin-clavulanate (AUGMENTIN) 875-125 MG tablet; Take 1 tablet by mouth 2 (two) times daily.   Dispense: 20 tablet; Refill: 0 - fluticasone (FLONASE) 50 MCG/ACT nasal spray; Place 2 sprays into both nostrils daily.  Dispense: 16 g; Refill: 6    General Counseling: Derek [name] verbalizes understanding of the findings of todays visit and agrees with plan of treatment. I have discussed any further diagnostic evaluation that may be needed or ordered today. We also reviewed his medications today. he has been encouraged to call the office with any questions or concerns that should arise related to todays visit.    Time spent:20 Minutes   Apolonio Schneiders [facility] Family Nurse Practitioner

## 2022-10-10 ENCOUNTER — Telehealth (HOSPITAL_COMMUNITY): Payer: Self-pay | Admitting: *Deleted

## 2022-10-10 NOTE — Telephone Encounter (Signed)
Reaching out to patient to offer assistance regarding upcoming cardiac imaging study; pt verbalizes understanding of appt date/time, parking situation and where to check in, pre-test NPO status  and verified current allergies; name and call back number provided for further questions should they arise ? ?Derrall Hicks RN Navigator Cardiac Imaging ?[facility] ?[phone] office ?[phone] cell ? ?

## 2022-10-12 ENCOUNTER — Ambulatory Visit
Admission: RE | Admit: 2022-10-12 | Discharge: 2022-10-12 | Disposition: A | Discharge status: Home / Self Care | Payer: BC Managed Care – PPO | Source: Ambulatory Visit | Attending: Cardiology | Admitting: Cardiology

## 2022-10-12 DIAGNOSIS — R079 Chest pain, unspecified: Secondary | ICD-10-CM | POA: Diagnosis not present

## 2022-10-12 MED ORDER — IOHEXOL 350 MG/ML SOLN
75.0000 mL | Freq: Once | INTRAVENOUS | Status: AC | PRN
  Administered 2022-10-12: 75 mL via INTRAVENOUS

## 2022-10-12 MED ORDER — NITROGLYCERIN 0.4 MG SL SUBL
0.8000 mg | SUBLINGUAL_TABLET | Freq: Once | SUBLINGUAL | Status: AC
  Administered 2022-10-12: 0.8 mg via SUBLINGUAL

## 2022-10-12 NOTE — Progress Notes (Signed)
Patient tolerated CT well. Drank water after. Vital signs stable encourage to drink water throughout day.Reasons explained and verbalized understanding. Ambulated steady gait.  

## 2022-10-24 ENCOUNTER — Ambulatory Visit (INDEPENDENT_AMBULATORY_CARE_PROVIDER_SITE_OTHER): Payer: Self-pay | Admitting: Physician Assistant

## 2022-10-24 ENCOUNTER — Encounter: Payer: Self-pay | Admitting: Physician Assistant

## 2022-10-24 VITALS — BP 122/80 | HR 74 | Temp 98.5°F

## 2022-10-24 DIAGNOSIS — R2242 Localized swelling, mass and lump, left lower limb: Secondary | ICD-10-CM

## 2022-10-24 DIAGNOSIS — M109 Gout, unspecified: Secondary | ICD-10-CM

## 2022-10-24 MED ORDER — INDOMETHACIN 50 MG PO CAPS: 50.0000 mg | ORAL_CAPSULE | Freq: Three times a day (TID) | ORAL | 0 refills | Status: DC

## 2022-10-24 MED ORDER — INDOMETHACIN 25 MG PO CAPS: 25.0000 mg | ORAL_CAPSULE | Freq: Three times a day (TID) | ORAL | 0 refills | Status: DC

## 2022-10-24 NOTE — Progress Notes (Signed)
[facility] [address], [place] [address]   Office Visit Note  Patient Name: Derek [name] Date of Birth [id number]  Medical Record number [id number]  Date of Service: 10/24/2022  Chief Complaint  Patient presents with   Foot Injury    Has gout in left foot. Some in the right, but left is worse. Takes meds for it not working.      57 y/o M presents to the clinic for c/o left foot swelling and pain x 2 weeks. Pt with h/o gout presenting with similar symptoms. Worse at night. He denies any traumatic injury to the foot. NO twisting of foot or ankle. No extended walking or standing. Has been not working due to holidays for the past 3 weeks. He denies fever or chills. He has been taking Allopurinol previously prescribe, but hasn't helped.   Foot Injury       Current Medication:  Outpatient Encounter Medications as of 10/24/2022  Medication Sig   albuterol (VENTOLIN HFA) 108 (90 Base) MCG/ACT inhaler TAKE 2 PUFFS BY MOUTH EVERY 6 HOURS AS NEEDED FOR WHEEZE OR SHORTNESS OF BREATH   allopurinol (ZYLOPRIM) 100 MG tablet TAKE 2 TABLETS (200 MG TOTAL) BY MOUTH ONCE DAILY.   amLODipine (NORVASC) 5 MG tablet Take 1 tablet (5 mg total) by mouth daily.   atenolol (TENORMIN) 50 MG tablet TAKE 1 TABLET BY MOUTH EVERY DAY   Budeson-Glycopyrrol-Formoterol (BREZTRI AEROSPHERE) 160-9-4.8 MCG/ACT AERO Inhale 2 puffs into the lungs in the morning and at bedtime.   citalopram (CELEXA) 40 MG tablet TAKE 1 TABLET BY MOUTH EVERY DAY   fluticasone (FLONASE) 50 MCG/ACT nasal spray Place 2 sprays into both nostrils daily.   [START ON 10/28/2022] indomethacin (INDOCIN) 25 MG capsule Take 1 capsule (25 mg total) by mouth 3 (three) times daily with meals.   indomethacin (INDOCIN) 50 MG capsule Take 1 capsule (50 mg total) by mouth 3 (three) times daily with meals. For first 4 days then following days take '25mg'$  capsules as prescribed.   naltrexone (DEPADE) 50 MG tablet Take 1 tablet (50 mg total) by  mouth daily.   sildenafil (VIAGRA) 100 MG tablet Take 1 tablet (100 mg total) by mouth daily as needed for erectile dysfunction.   valsartan (DIOVAN) 160 MG tablet Take 1 tablet (160 mg total) by mouth daily.   amoxicillin-clavulanate (AUGMENTIN) 875-125 MG tablet Take 1 tablet by mouth 2 (two) times daily. (Patient not taking: Reported on 10/24/2022)   baclofen (LIORESAL) 10 MG tablet Take 1 tablet (10 mg total) by mouth 3 (three) times daily. (Patient not taking: Reported on 09/25/2022)   colchicine 0.6 MG tablet TAKE 2 TABLETS FIRST DAY, THEN ONE TABLET DAILY FOR GOUT ATTACK   fenofibrate (TRICOR) 145 MG tablet Take 1 tablet (145 mg total) by mouth daily. (Patient not taking: Reported on 10/24/2022)   omeprazole (PRILOSEC OTC) 20 MG tablet Take 1 tablet (20 mg total) by mouth daily for 14 days.   No facility-administered encounter medications on file as of 10/24/2022.      Medical History: Past Medical History:  Diagnosis Date   Gout    Hypertension      Vital Signs: BP 122/80 (BP Location: Left Arm, Patient Position: Sitting, Cuff Size: Normal)   Pulse 74   Temp 98.5 F (36.9 C) (Tympanic)   SpO2 98%    Review of Systems  Constitutional: Negative.   Musculoskeletal:  Positive for arthralgias, gait problem and joint swelling. Negative for back  pain, myalgias, neck pain and neck stiffness.  Skin:  Positive for color change (faint redness present on the left foot).    Physical Exam Constitutional:      Appearance: Normal appearance.  HENT:     Head: Normocephalic and atraumatic.     Right Ear: External ear normal.     Left Ear: External ear normal.  Eyes:     Extraocular Movements: Extraocular movements intact.  Musculoskeletal:     Comments: Left foot: +TTP over the dorsal foot. + swelling of the dorsal foot. N-V intact. Gait is abnormal. Non-tender bilateral malleoli  Skin:    General: Skin is warm and dry.  Neurological:     General: No focal deficit present.      Mental Status: He is alert and oriented to person, place, and time.  Psychiatric:        Mood and Affect: Mood normal.        Behavior: Behavior normal.       Assessment/Plan:  1. Acute gout of left foot, unspecified cause - CBC w/Diff - Uric acid - indomethacin (INDOCIN) 50 MG capsule; Take 1 capsule (50 mg total) by mouth 3 (three) times daily with meals. For first 4 days then following days take '25mg'$  capsules as prescribed.  Dispense: 12 capsule; Refill: 0 - indomethacin (INDOCIN) 25 MG capsule; Take 1 capsule (25 mg total) by mouth 3 (three) times daily with meals.  Dispense: 15 capsule; Refill: 0  2. Localized swelling of left foot - CBC w/Diff - Uric acid  Await blood work to r/o infection. Take medicine as prescribed.  Restrict diet with alcohol, red meats, cheese, etc. Pt verbalized understanding and in agreement.    General Counseling: Pamala Hurry understanding of the findings of todays visit and agrees with plan of treatment. I have discussed any further diagnostic evaluation that may be needed or ordered today. We also reviewed his medications today. he has been encouraged to call the office with any questions or concerns that should arise related to todays visit.    Time spent:20 [place], [place] Physician Assistant

## 2022-10-25 LAB — CBC WITH DIFFERENTIAL/PLATELET
Basophils Absolute: 0.1 10*3/uL (ref 0.0–0.2)
Basos: 1 %
EOS (ABSOLUTE): 0.4 10*3/uL (ref 0.0–0.4)
Eos: 4 %
Hematocrit: 44.9 % (ref 37.5–51.0)
Hemoglobin: 15.8 g/dL (ref 13.0–17.7)
Immature Grans (Abs): 0 10*3/uL (ref 0.0–0.1)
Immature Granulocytes: 0 %
Lymphocytes Absolute: 2 10*3/uL (ref 0.7–3.1)
Lymphs: 19 %
MCH: 32.8 pg (ref 26.6–33.0)
MCHC: 35.2 g/dL (ref 31.5–35.7)
MCV: 93 fL (ref 79–97)
Monocytes Absolute: 0.9 10*3/uL (ref 0.1–0.9)
Monocytes: 9 %
Neutrophils Absolute: 7.2 10*3/uL — ABNORMAL HIGH (ref 1.4–7.0)
Neutrophils: 67 %
Platelets: 301 10*3/uL (ref 150–450)
RBC: 4.81 x10E6/uL (ref 4.14–5.80)
RDW: 12.5 % (ref 11.6–15.4)
WBC: 10.6 10*3/uL (ref 3.4–10.8)

## 2022-10-25 LAB — URIC ACID: Uric Acid: 4.6 mg/dL (ref 3.8–8.4)

## 2022-11-07 ENCOUNTER — Encounter: Payer: Self-pay | Admitting: Physician Assistant

## 2022-11-07 ENCOUNTER — Telehealth (INDEPENDENT_AMBULATORY_CARE_PROVIDER_SITE_OTHER): Payer: BC Managed Care – PPO | Admitting: Physician Assistant

## 2022-11-07 ENCOUNTER — Ambulatory Visit: Payer: Self-pay

## 2022-11-07 ENCOUNTER — Ambulatory Visit (INDEPENDENT_AMBULATORY_CARE_PROVIDER_SITE_OTHER): Payer: Self-pay | Admitting: Physician Assistant

## 2022-11-07 DIAGNOSIS — M109 Gout, unspecified: Secondary | ICD-10-CM | POA: Diagnosis not present

## 2022-11-07 DIAGNOSIS — M79671 Pain in right foot: Secondary | ICD-10-CM

## 2022-11-07 MED ORDER — INDOMETHACIN 25 MG PO CAPS: 25.0000 mg | ORAL_CAPSULE | Freq: Three times a day (TID) | ORAL | 0 refills | Status: DC

## 2022-11-07 MED ORDER — INDOMETHACIN 50 MG PO CAPS: 50.0000 mg | ORAL_CAPSULE | Freq: Three times a day (TID) | ORAL | 0 refills | Status: DC

## 2022-11-07 NOTE — Telephone Encounter (Signed)
Summary: gout flare up   Right leg unable to stand due to gout flare up and states allopurinol is not working. Requesting PCP to send in a rX     Reason for Disposition . Pain in the big toe joint  Answer Assessment - Initial Assessment Questions 1. ONSET: "When did the pain start?"      3 weeks - Friday got real 2. LOCATION: "Where is the pain located?"      Right foot 3. PAIN: "How bad is the pain?"    (Scale 1-10; or mild, moderate, severe)  - MILD (1-3): doesn't interfere with normal activities.   - MODERATE (4-7): interferes with normal activities (e.g., work or school) or awakens from sleep, limping.   - SEVERE (8-10): excruciating pain, unable to do any normal activities, unable to walk.      severe 4. WORK OR EXERCISE: "Has there been any recent work or exercise that involved this part of the body?"       5. CAUSE: "What do you think is causing the foot pain?"     Gout 6. OTHER SYMPTOMS: "Do you have any other symptoms?" (e.g., leg pain, rash, fever, numbness)      7. PREGNANCY: "Is there any chance you are pregnant?" "When was your last menstrual period?"  Protocols used: Foot Pain-A-AH

## 2022-11-07 NOTE — Progress Notes (Signed)
MyChart Video Visit    Virtual Visit via Video Note   This format is felt to be most appropriate for this patient at this time. Physical exam was limited by quality of the video and audio technology used for the visit.   Patient location: home Provider location: [facility]  I discussed the limitations of evaluation and management by telemedicine and the availability of in person appointments. The patient expressed understanding and agreed to proceed.  Patient: Derek [name]   DOB: [birth year]   57 y.o. Male  MRN: [id number] Visit Date: 11/07/2022  Today's healthcare provider: Mikey Kirschner, PA-C   Chief Complaint  Patient presents with   Gout    Pt stated--right foot great toe--warm to touch, swollen, pain w/ pressure--3 weeks.   Subjective    HPI  Pt reports a gout flare of his right great toe, warm to touch, swollen and painful. He was seen virtually earlier today and prescribed indomethacin. He reports he just had a flare 3 weeks ago which resolved and now he has another. He doesn't think the allopurinol is working.   He admits to not 'eating perfectly'.    Medications: Outpatient Medications Prior to Visit  Medication Sig   albuterol (VENTOLIN HFA) 108 (90 Base) MCG/ACT inhaler TAKE 2 PUFFS BY MOUTH EVERY 6 HOURS AS NEEDED FOR WHEEZE OR SHORTNESS OF BREATH   allopurinol (ZYLOPRIM) 100 MG tablet TAKE 2 TABLETS (200 MG TOTAL) BY MOUTH ONCE DAILY.   amLODipine (NORVASC) 5 MG tablet Take 1 tablet (5 mg total) by mouth daily.   atenolol (TENORMIN) 50 MG tablet TAKE 1 TABLET BY MOUTH EVERY DAY   Budeson-Glycopyrrol-Formoterol (BREZTRI AEROSPHERE) 160-9-4.8 MCG/ACT AERO Inhale 2 puffs into the lungs in the morning and at bedtime.   citalopram (CELEXA) 40 MG tablet TAKE 1 TABLET BY MOUTH EVERY DAY   fluticasone (FLONASE) 50 MCG/ACT nasal spray Place 2 sprays into both nostrils daily.   indomethacin (INDOCIN) 25 MG capsule Take 1 capsule (25 mg total) by mouth 3 (three) times daily  with meals.   indomethacin (INDOCIN) 50 MG capsule Take 1 capsule (50 mg total) by mouth 3 (three) times daily with meals. For first 4 days then following days take '25mg'$  capsules as prescribed.   sildenafil (VIAGRA) 100 MG tablet Take 1 tablet (100 mg total) by mouth daily as needed for erectile dysfunction.   valsartan (DIOVAN) 160 MG tablet Take 1 tablet (160 mg total) by mouth daily.   amoxicillin-clavulanate (AUGMENTIN) 875-125 MG tablet Take 1 tablet by mouth 2 (two) times daily. (Patient not taking: Reported on 10/24/2022)   baclofen (LIORESAL) 10 MG tablet Take 1 tablet (10 mg total) by mouth 3 (three) times daily. (Patient not taking: Reported on 09/25/2022)   colchicine 0.6 MG tablet TAKE 2 TABLETS FIRST DAY, THEN ONE TABLET DAILY FOR GOUT ATTACK (Patient not taking: Reported on 11/07/2022)   fenofibrate (TRICOR) 145 MG tablet Take 1 tablet (145 mg total) by mouth daily. (Patient not taking: Reported on 10/24/2022)   naltrexone (DEPADE) 50 MG tablet Take 1 tablet (50 mg total) by mouth daily. (Patient not taking: Reported on 11/07/2022)   omeprazole (PRILOSEC OTC) 20 MG tablet Take 1 tablet (20 mg total) by mouth daily for 14 days.   No facility-administered medications prior to visit.      Objective    There were no vitals taken for this visit.    Physical Exam Constitutional:      Appearance: Normal appearance. He is not  toxic-appearing.  Neurological:     Mental Status: He is oriented to person, place, and time.  Psychiatric:        Mood and Affect: Mood normal.        Behavior: Behavior normal.        Assessment & Plan     Problem List Items Addressed This Visit       Other   Gout - Primary    Last uric acid was in range. Advised this indicates the allopurinol is working Advised his flares are likely due to his diet -- the last time his cholesterol panel was checked, his triglycerides were very high as well. Advised that this tells [place] he is likely eating things that  are triggering gout flares. If another flare occurs < 1 mo recommend f/u with pcp        Return if symptoms worsen or fail to improve.     I discussed the assessment and treatment plan with the patient. The patient was provided an opportunity to ask questions and all were answered. The patient agreed with the plan and demonstrated an understanding of the instructions.   The patient was advised to call back or seek an in-person evaluation if the symptoms worsen or if the condition fails to improve as anticipated.  I provided 7 minutes of non-face-to-face time during this encounter.  I, Mikey Kirschner, PA-C have reviewed all documentation for this visit. The documentation on  11/07/22 for the exam, diagnosis, procedures, and orders are all accurate and complete.  Mikey Kirschner, PA-C [facility] [address] [place], [place], [address] Office: [phone] Fax: [place]

## 2022-11-07 NOTE — Assessment & Plan Note (Addendum)
Last uric acid just 2 weeks ago was in range. Advised this indicates the allopurinol is working Advised his flares are likely due to his diet -- the last time his cholesterol panel was checked, his triglycerides were very high as well. Advised that this tells [place] he is likely eating things that are triggering gout flares. If another flare occurs < 1 mo recommend f/u with pcp

## 2022-11-07 NOTE — Progress Notes (Signed)
Virtual Visit Consent   Derek [name], you are scheduled for a virtual visit with a [facility] provider today. Just as with appointments in the office, your consent must be obtained to participate. Your consent will be active for this visit and any virtual visit you may have with one of our providers in the next 365 days. If you have a MyChart account, a copy of this consent can be sent to you electronically.  As this is a virtual telephone visit which doesn't allow your provider to perform a traditional examination and may limit your provider's ability to fully assess your condition. If your provider identifies any concerns that need to be evaluated in person or the need to arrange testing (such as labs, EKG, etc.), we will make arrangements to do so. Although advances in technology are sophisticated, we cannot ensure that it will always work on either your end or our end. If the connection with a video visit is poor, the visit may have to be switched to a telephone visit. With either a video or telephone visit, we are not always able to ensure that we have a secure connection.  By engaging in this virtual visit, you consent to the provision of healthcare and authorize for your insurance to be billed (if applicable) for the services provided during this visit. Depending on your insurance coverage, you may receive a charge related to this service.  I need to obtain your verbal consent now. Are you willing to proceed with your visit today? Derek [name] has provided verbal consent on 11/07/2022 for a virtual visit (video or telephone). Derek [name], [place]  Date: 11/07/2022 9:32 AM  Virtual Visit via Telephone Note   I, Derek [name], connected with  Derek [name]  ([id number], [birth year]) on 11/07/22 at  9:30 AM EST by a telephone and verified that I am speaking with the correct person using two identifiers.  Location: Patient: Virtual Visit Location Patient: Home Provider: Virtual Visit Location  Provider: Office/Clinic   I discussed the limitations of evaluation and management by telemedicine and the availability of in person appointments. The patient expressed understanding and agreed to proceed.    History of Present Illness: Derek [name] is a 57 y.o. who identifies as a male who was assigned male at birth, and is being seen today for right foot pain.  HPI: 57 y/o M c/o right foot pain via telehealth visit. Pt with h/o gout and was treated with Indocin about 2 weeks ago and noticed pain return after finishing medicine.   Muscle Pain    Problems:  Patient Active Problem List   Diagnosis Date Noted   Hyperuricemia 09/08/2022   GAD (generalized anxiety disorder) 08/01/2022   Hypertriglyceridemia 08/01/2022   Chest pain 08/01/2022   Left leg pain 08/01/2022   Hamstring muscle strain, right, initial encounter 05/02/2022   Aortic atherosclerosis ([place]) 02/24/2022   COPD (chronic obstructive pulmonary disease) ([place]) 12/16/2021   Asthma 05/16/2021   Adrenal adenoma, right 01/03/2016   Cervical nerve root disorder 01/03/2016   Displacement of cervical intervertebral disc without myelopathy 01/03/2016   Erectile dysfunction 01/03/2016   Family history of malignant neoplasm of prostate 01/03/2016   H/O arthrodesis 01/03/2016   Gout 01/03/2016   Headache, migraine 01/03/2016   Calculus of kidney 01/03/2016   Backhand tennis elbow 01/03/2016   Disordered sleep 06/23/2014   B12 deficiency 06/23/2014   Degeneration of cervical intervertebral disc 01/25/2010   Lipoma of skin 07/21/2009   Primary hypertension  11/25/2007   Awareness of heartbeats 11/25/2007   Tobacco use 11/25/2007    Allergies: No Known Allergies Medications:  Current Outpatient Medications:    albuterol (VENTOLIN HFA) 108 (90 Base) MCG/ACT inhaler, TAKE 2 PUFFS BY MOUTH EVERY 6 HOURS AS NEEDED FOR WHEEZE OR SHORTNESS OF BREATH, Disp: 18 each, Rfl: 6   allopurinol (ZYLOPRIM) 100 MG tablet, TAKE 2 TABLETS (200  MG TOTAL) BY MOUTH ONCE DAILY., Disp: 180 tablet, Rfl: 3   amLODipine (NORVASC) 5 MG tablet, Take 1 tablet (5 mg total) by mouth daily., Disp: 90 tablet, Rfl: 1   amoxicillin-clavulanate (AUGMENTIN) 875-125 MG tablet, Take 1 tablet by mouth 2 (two) times daily. (Patient not taking: Reported on 10/24/2022), Disp: 20 tablet, Rfl: 0   atenolol (TENORMIN) 50 MG tablet, TAKE 1 TABLET BY MOUTH EVERY DAY, Disp: 90 tablet, Rfl: 1   baclofen (LIORESAL) 10 MG tablet, Take 1 tablet (10 mg total) by mouth 3 (three) times daily. (Patient not taking: Reported on 09/25/2022), Disp: 30 each, Rfl: 0   Budeson-Glycopyrrol-Formoterol (BREZTRI AEROSPHERE) 160-9-4.8 MCG/ACT AERO, Inhale 2 puffs into the lungs in the morning and at bedtime., Disp: 59 g, Rfl: 0   citalopram (CELEXA) 40 MG tablet, TAKE 1 TABLET BY MOUTH EVERY DAY, Disp: 90 tablet, Rfl: 1   colchicine 0.6 MG tablet, TAKE 2 TABLETS FIRST DAY, THEN ONE TABLET DAILY FOR GOUT ATTACK, Disp: 90 tablet, Rfl: 0   fenofibrate (TRICOR) 145 MG tablet, Take 1 tablet (145 mg total) by mouth daily. (Patient not taking: Reported on 10/24/2022), Disp: 90 tablet, Rfl: 0   fluticasone (FLONASE) 50 MCG/ACT nasal spray, Place 2 sprays into both nostrils daily., Disp: 16 g, Rfl: 6   indomethacin (INDOCIN) 25 MG capsule, Take 1 capsule (25 mg total) by mouth 3 (three) times daily with meals., Disp: 15 capsule, Rfl: 0   indomethacin (INDOCIN) 50 MG capsule, Take 1 capsule (50 mg total) by mouth 3 (three) times daily with meals. For first 4 days then following days take '25mg'$  capsules as prescribed., Disp: 12 capsule, Rfl: 0   naltrexone (DEPADE) 50 MG tablet, Take 1 tablet (50 mg total) by mouth daily., Disp: 30 tablet, Rfl: 3   omeprazole (PRILOSEC OTC) 20 MG tablet, Take 1 tablet (20 mg total) by mouth daily for 14 days., Disp: 14 tablet, Rfl: 0   sildenafil (VIAGRA) 100 MG tablet, Take 1 tablet (100 mg total) by mouth daily as needed for erectile dysfunction., Disp: 30 tablet, Rfl: 5    valsartan (DIOVAN) 160 MG tablet, Take 1 tablet (160 mg total) by mouth daily., Disp: 90 tablet, Rfl: 1  Observations/Objective: Patient is well-developed, well-nourished in no acute distress.  Resting comfortably at home.  Head is normocephalic, atraumatic.  No labored breathing.  Speech is clear and coherent with logical content.  Patient is alert and oriented at baseline.    Assessment and Plan: 1. Right foot pain - indomethacin (INDOCIN) 50 MG capsule; Take 1 capsule (50 mg total) by mouth 3 (three) times daily with meals. For first 4 days then following days take '25mg'$  capsules as prescribed.  Dispense: 12 capsule; Refill: 0 - indomethacin (INDOCIN) 25 MG capsule; Take 1 capsule (25 mg total) by mouth 3 (three) times daily with meals.  Dispense: 15 capsule; Refill: 0  Recommend patient to f/u with PCP for further evaluation. Pt has been on Allopurinol for prevention of gout and states that this medicine is not helping anymore because more frequent gout flare-ups.  Reviewed previous normal  uric acid result and CBC results from 2 weeks ago.  Pt needs to f/u with PCP for further refills if needed.  Pt verbalized understanding and in agreement.    Follow Up Instructions: I discussed the assessment and treatment plan with the patient. The patient was provided an opportunity to ask questions and all were answered. The patient agreed with the plan and demonstrated an understanding of the instructions.  A copy of instructions were sent to the patient via MyChart unless otherwise noted below.    The patient was advised to call back or seek an in-person evaluation if the symptoms worsen or if the condition fails to improve as anticipated.  Time:  I spent 10 minutes with the patient via telehealth technology discussing the above problems/concerns.    Derek Acosta, PA-C

## 2022-11-07 NOTE — Telephone Encounter (Signed)
  Chief Complaint: Gout attack Symptoms: Pain swelling Frequency: 3 weeks Pertinent Negatives: Patient denies  Disposition: '[]'$ ED /'[]'$ Urgent Care (no appt availability in office) / '[x]'$ Appointment(In office/virtual)/ '[]'$  [facility]/ '[]'$ Home Care/ '[]'$ Refused Recommended Disposition /'[]'$ [facility]/ '[]'$  Follow-up with PCP Additional Notes: PT has had a gout attack for the past 3 weeks. Pt states that medication is not working to control gout. Pt was prescribed indomethacin for flare today.  Pt would like to discuss alternative preventative medications.

## 2022-11-09 ENCOUNTER — Ambulatory Visit: Payer: Self-pay | Admitting: *Deleted

## 2022-11-09 ENCOUNTER — Encounter: Payer: Self-pay | Admitting: Physician Assistant

## 2022-11-09 ENCOUNTER — Ambulatory Visit: Payer: BC Managed Care – PPO | Admitting: Physician Assistant

## 2022-11-09 VITALS — BP 135/83 | HR 60 | Temp 97.6°F | Ht 70.0 in | Wt 192.0 lb

## 2022-11-09 DIAGNOSIS — M109 Gout, unspecified: Secondary | ICD-10-CM

## 2022-11-09 MED ORDER — PREDNISONE 10 MG PO TABS: ORAL_TABLET | ORAL | 0 refills | Status: DC

## 2022-11-09 NOTE — Progress Notes (Signed)
Established patient visit   Patient: Derek [name]   DOB: June 08, 1966   57 y.o. Male  MRN: [id number] Visit Date: 11/09/2022  Today's healthcare provider: Mikey Kirschner, PA-C   Cc. Gout flare right foot  Subjective    HPI   Pt has been taking indomethacin since 1/16 for gout flare of the right foot but pain and swelling has only worsened. Unable to walk on that foot. Denies redness, fever.  -----------------------------------------------------------------------------------------   Medications: Outpatient Medications Prior to Visit  Medication Sig   albuterol (VENTOLIN HFA) 108 (90 Base) MCG/ACT inhaler TAKE 2 PUFFS BY MOUTH EVERY 6 HOURS AS NEEDED FOR WHEEZE OR SHORTNESS OF BREATH   allopurinol (ZYLOPRIM) 100 MG tablet TAKE 2 TABLETS (200 MG TOTAL) BY MOUTH ONCE DAILY.   amLODipine (NORVASC) 5 MG tablet Take 1 tablet (5 mg total) by mouth daily.   amoxicillin-clavulanate (AUGMENTIN) 875-125 MG tablet Take 1 tablet by mouth 2 (two) times daily.   atenolol (TENORMIN) 50 MG tablet TAKE 1 TABLET BY MOUTH EVERY DAY   baclofen (LIORESAL) 10 MG tablet Take 1 tablet (10 mg total) by mouth 3 (three) times daily.   Budeson-Glycopyrrol-Formoterol (BREZTRI AEROSPHERE) 160-9-4.8 MCG/ACT AERO Inhale 2 puffs into the lungs in the morning and at bedtime.   citalopram (CELEXA) 40 MG tablet TAKE 1 TABLET BY MOUTH EVERY DAY   colchicine 0.6 MG tablet TAKE 2 TABLETS FIRST DAY, THEN ONE TABLET DAILY FOR GOUT ATTACK   fenofibrate (TRICOR) 145 MG tablet Take 1 tablet (145 mg total) by mouth daily.   fluticasone (FLONASE) 50 MCG/ACT nasal spray Place 2 sprays into both nostrils daily.   indomethacin (INDOCIN) 25 MG capsule Take 1 capsule (25 mg total) by mouth 3 (three) times daily with meals.   indomethacin (INDOCIN) 50 MG capsule Take 1 capsule (50 mg total) by mouth 3 (three) times daily with meals. For first 4 days then following days take '25mg'$  capsules as prescribed.   naltrexone (DEPADE) 50 MG  tablet Take 1 tablet (50 mg total) by mouth daily.   sildenafil (VIAGRA) 100 MG tablet Take 1 tablet (100 mg total) by mouth daily as needed for erectile dysfunction.   valsartan (DIOVAN) 160 MG tablet Take 1 tablet (160 mg total) by mouth daily.   omeprazole (PRILOSEC OTC) 20 MG tablet Take 1 tablet (20 mg total) by mouth daily for 14 days.   No facility-administered medications prior to visit.    Review of Systems  Constitutional:  Negative for fatigue and fever.  Respiratory:  Negative for cough and shortness of breath.   Cardiovascular:  Negative for chest pain, palpitations and leg swelling.  Musculoskeletal:  Positive for joint swelling.  Skin:  Positive for color change.  Neurological:  Negative for dizziness and headaches.      Objective    BP 135/83 (BP Location: Left Arm, Patient Position: Sitting, Cuff Size: Normal)   Pulse 60   Temp 97.6 F (36.4 C)   Ht '5\' 10"'$  (1.778 m)   Wt 192 lb (87.1 kg)   SpO2 96%   BMI 27.55 kg/m    Physical Exam Vitals reviewed.  Constitutional:      Appearance: He is not ill-appearing.  HENT:     Head: Normocephalic.  Eyes:     Conjunctiva/sclera: Conjunctivae normal.  Cardiovascular:     Rate and Rhythm: Normal rate.  Pulmonary:     Effort: Pulmonary effort is normal. No respiratory distress.  Feet:  Comments: Right foot with significant edema from toes to ankle. No erythema or significant warm. Acutely tender to R big toe and ankle Neurological:     General: No focal deficit present.     Mental Status: He is alert and oriented to person, place, and time.  Psychiatric:        Mood and Affect: Mood normal.        Behavior: Behavior normal.     No results found for any visits on 11/09/22.  Assessment & Plan     Problem List Items Addressed This Visit       Other   Gout - Primary    Worsening on indomethacin Rx prednisone taper Does not appear cellulitic today-- but advised if no improvement on day 3 of prednisone  or any worsening of symptoms to contact office and I can rx antibiotics.      Relevant Medications   predniSONE (DELTASONE) 10 MG tablet     Return if symptoms worsen or fail to improve.      I, Mikey Kirschner, PA-C have reviewed all documentation for this visit. The documentation on  11/09/22 for the exam, diagnosis, procedures, and orders are all accurate and complete.  Mikey Kirschner, PA-C [facility] [address] [place], [place], [address] Office: [phone] Fax: [place]

## 2022-11-09 NOTE — Assessment & Plan Note (Signed)
Worsening on indomethacin Rx prednisone taper Does not appear cellulitic today-- but advised if no improvement on day 3 of prednisone or any worsening of symptoms to contact office and I can rx antibiotics.

## 2022-11-09 NOTE — Telephone Encounter (Signed)
Reason for Disposition . [1] SEVERE pain (e.g., excruciating, unable to do any normal activities) AND [2] not improved after 2 hours of pain medicine  Answer Assessment - Initial Assessment Questions 1. ONSET: "When did the pain start?"      2-3 weeks- 4-5 days worse 2. LOCATION: "Where is the pain located?"      R foot 3. PAIN: "How bad is the pain?"    (Scale 1-10; or mild, moderate, severe)  - MILD (1-3): doesn't interfere with normal activities.   - MODERATE (4-7): interferes with normal activities (e.g., work or school) or awakens from sleep, limping.   - SEVERE (8-10): excruciating pain, unable to do any normal activities, unable to walk.      severe 4. WORK OR EXERCISE: "Has there been any recent work or exercise that involved this part of the body?"      On feet at work 5. CAUSE: "What do you think is causing the foot pain?"     Gout flare 6. OTHER SYMPTOMS: "Do you have any other symptoms?" (e.g., leg pain, rash, fever, numbness)     no  Protocols used: Foot Pain-A-AH

## 2022-11-09 NOTE — Telephone Encounter (Signed)
  Chief Complaint: gout not helping flare- treatment not helping Symptoms: severe pain in foot Frequency: 2-3 weeks- worse 4-5 days Pertinent Negatives: Patient denies leg pain, rash, fever, numbness Disposition: '[]'$ ED /'[]'$ Urgent Care (no appt availability in office) / '[x]'$ Appointment(In office/virtual)/ '[]'$  [facility]/ '[]'$ Home Care/ '[]'$ Refused Recommended Disposition /'[]'$ [facility]/ '[]'$  Follow-up with PCP Additional Notes: Patient had virtual visit- not better- appointment in office scheduled

## 2022-11-15 ENCOUNTER — Other Ambulatory Visit: Payer: Self-pay | Admitting: Physician Assistant

## 2022-11-15 DIAGNOSIS — I10 Essential (primary) hypertension: Secondary | ICD-10-CM

## 2022-11-16 ENCOUNTER — Ambulatory Visit: Payer: BC Managed Care – PPO | Attending: Cardiology

## 2022-11-16 DIAGNOSIS — R072 Precordial pain: Secondary | ICD-10-CM

## 2022-11-17 LAB — ECHOCARDIOGRAM COMPLETE
AR max vel: 3.31 cm2
AV Area VTI: 3.5 cm2
AV Area mean vel: 3.43 cm2
AV Mean grad: 5 mmHg
AV Peak grad: 11.4 mmHg
Ao pk vel: 1.69 m/s
Area-P 1/2: 3.51 cm2
Calc EF: 63.2 %
S' Lateral: 3.5 cm
Single Plane A2C EF: 62.6 %
Single Plane A4C EF: 64.6 %

## 2022-11-18 ENCOUNTER — Telehealth: Payer: Self-pay | Admitting: Family Medicine

## 2022-11-18 NOTE — Telephone Encounter (Signed)
Need to increase allopurinol to '300mg'$  daily with goal of getting uric acid under 4.5.  also need to take colchicine once every day for the next month.  Please send in prescription for allopurinol '300mg'$  one tablet daily, #90 rf x 3. Please sent in refill for colchicine one tablet daily #30, rf x 3 Advise he needs to strictly avoid alcohol which is another trigger for gout

## 2022-11-18 NOTE — Telephone Encounter (Addendum)
Message Received: 11/09/2022 Mikey Kirschner, PA-C  Birdie Sons, MD Im treating Sabre for an acute gout flare-- he has had two flares in two months and does not think his allopurinol is working. His last uric acid was in range. I encouraged him to continue taking but he wanted to reach out to see if you would put him on an alternative medication.  thanks

## 2022-11-20 ENCOUNTER — Ambulatory Visit: Payer: BC Managed Care – PPO | Attending: Cardiology | Admitting: Cardiology

## 2022-11-20 ENCOUNTER — Encounter: Payer: Self-pay | Admitting: Cardiology

## 2022-11-20 ENCOUNTER — Ambulatory Visit: Payer: Self-pay | Admitting: *Deleted

## 2022-11-20 VITALS — BP 140/68 | HR 62 | Ht 70.0 in | Wt 193.8 lb

## 2022-11-20 DIAGNOSIS — R072 Precordial pain: Secondary | ICD-10-CM | POA: Diagnosis not present

## 2022-11-20 DIAGNOSIS — I10 Essential (primary) hypertension: Secondary | ICD-10-CM | POA: Diagnosis not present

## 2022-11-20 DIAGNOSIS — E781 Pure hyperglyceridemia: Secondary | ICD-10-CM

## 2022-11-20 DIAGNOSIS — F17200 Nicotine dependence, unspecified, uncomplicated: Secondary | ICD-10-CM | POA: Diagnosis not present

## 2022-11-20 NOTE — Telephone Encounter (Signed)
   Per agent: "Pt was seen on 1/18 for gout in his right foot and given prednisone. Patient was told if symptoms did not improve provider would send in rx. Patient states as soon as he stopped the prednisone his foot started hurting again."     Chief Complaint: "Gout pain" Symptoms: Right foot pain 7/10 Frequency: Seen 11/09/22, prednisone for gout Pertinent Negatives: Patient denies swelling Disposition: '[]'$ ED /'[]'$ Urgent Care (no appt availability in office) / '[]'$ Appointment(In office/virtual)/ '[]'$  [facility]/ '[]'$ Home Care/ '[]'$ Refused Recommended Disposition /'[]'$ [facility] [facility]/ '[x]'$  Follow-up with PCP Additional Notes: States completed prednisone 1 week ago, pain occurred shortly after, worsening each day. States was told to [facility] for additional meds. Please advise  Reason for Disposition  [1] MODERATE pain (e.g., interferes with normal activities, limping) AND [2] present > 3 days  Answer Assessment - Initial Assessment Questions 1. ONSET: "When did the pain start?"      1 week ago 2. LOCATION: "Where is the pain located?"      Right foot 3. PAIN: "How bad is the pain?"    (Scale 1-10; or mild, moderate, severe)  - MILD (1-3): doesn't interfere with normal activities.   - MODERATE (4-7): interferes with normal activities (e.g., work or school) or awakens from sleep, limping.   - SEVERE (8-10): excruciating pain, unable to do any normal activities, unable to walk.      7/10 4. WORK OR EXERCISE: "Has there been any recent work or exercise that involved this part of the body?"      GOut Dx 5. CAUSE: "What do you think is causing the foot pain?"     Gout 6. OTHER SYMPTOMS: "Do you have any other symptoms?" (e.g., leg pain, rash, fever, numbness)     no  Protocols used: Foot Pain-A-AH

## 2022-11-20 NOTE — Patient Instructions (Addendum)
Medication Instructions:   Your physician recommends that you continue on your current medications as directed. Please refer to the Current Medication list given to you today.  *If you need a refill on your cardiac medications before your next appointment, please call your pharmacy*   Lab Work:  Your physician recommends that you return for lab work in: 6 weeks (around 12/03/2022) [facility] mall. You will need to be fasting. No appt is needed. Hours are M-F 7AM- 6 PM.  If you have labs (blood work) drawn today and your tests are completely normal, you will receive your results only by: [place] (if you have MyChart) OR A paper copy in the mail If you have any lab test that is abnormal or we need to change your treatment, we will call you to review the results.   Testing/Procedures:  None Ordered   Follow-Up: At [facility], you and your health needs are our priority.  As part of our continuing mission to provide you with exceptional heart care, we have created designated Provider Care Teams.  These Care Teams include your primary Cardiologist (physician) and Advanced Practice Providers (APPs -  Physician Assistants and Nurse Practitioners) who all work together to provide you with the care you need, when you need it.  We recommend signing up for the patient portal called "MyChart".  Sign up information is provided on this After Visit Summary.  MyChart is used to connect with patients for Virtual Visits (Telemedicine).  Patients are able to view lab/test results, encounter notes, upcoming appointments, etc.  Non-urgent messages can be sent to your provider as well.   To learn more about what you can do with MyChart, go to [email or web address].    Your next appointment:   3 month(s)  Provider:   You may see Kate Sable, MD or one of the following Advanced Practice Providers on your designated Care Team:   Murray Hodgkins, NP Christell Faith, PA-C [place]  Kathlen Mody, PA-C Gerrie Nordmann, NP

## 2022-11-20 NOTE — Progress Notes (Signed)
Cardiology Office Note:    Date:  11/20/2022   ID:  Derek [name], DOB 1966-05-18, MRN [id number]  PCP:  Birdie Sons, MD   [place] Prov[facility] Cardiologist:  Kate Sable, MD     Referring MD: Birdie Sons, MD   Chief Complaint  Patient presents with   Follow-up    Testing f/u, no new cardiac concerns     History of Present Illness:    Derek [name] is a 57 y.o. male with a hx of hypertension, elevated triglycerides, current smoker 30+ years, who presents for follow-up.  Previously seen with chest pain.  Due to risk factors, echo and coronary CT was obtained.  Prilosec started due to history of heartburn.  Fenofibrate started for elevated triglycerides.  Tolerating medications with no adverse effects.  Presents for testing results.  Prilosec seems to be helping with chest pain.  Has right toe pain which he attributes to gout.  States BP a little elevated today due to that.  Otherwise feels well, no concerns at this time.  Presents for testing results.    Past Medical History:  Diagnosis Date   Gout    Hypertension     Past Surgical History:  Procedure Laterality Date   CERVICAL DISC SURGERY     COLONOSCOPY WITH PROPOFOL N/A 02/18/2018   Procedure: COLONOSCOPY WITH PROPOFOL;  Surgeon: Lin Landsman, MD;  Location: [facility] ENDOSCOPY;  Service: Gastroenterology;  Laterality: N/A;   TONSILLECTOMY AND ADENOIDECTOMY      Current Medications: Current Meds  Medication Sig   albuterol (VENTOLIN HFA) 108 (90 Base) MCG/ACT inhaler TAKE 2 PUFFS BY MOUTH EVERY 6 HOURS AS NEEDED FOR WHEEZE OR SHORTNESS OF BREATH   allopurinol (ZYLOPRIM) 100 MG tablet TAKE 2 TABLETS (200 MG TOTAL) BY MOUTH ONCE DAILY.   amLODipine (NORVASC) 5 MG tablet Take 1 tablet (5 mg total) by mouth daily.   atenolol (TENORMIN) 50 MG tablet TAKE 1 TABLET BY MOUTH EVERY DAY   Budeson-Glycopyrrol-Formoterol (BREZTRI AEROSPHERE) 160-9-4.8 MCG/ACT AERO Inhale 2 puffs into the lungs in the  morning and at bedtime.   citalopram (CELEXA) 40 MG tablet TAKE 1 TABLET BY MOUTH EVERY DAY   fluticasone (FLONASE) 50 MCG/ACT nasal spray Place 2 sprays into both nostrils daily.   naltrexone (DEPADE) 50 MG tablet Take 1 tablet (50 mg total) by mouth daily. (Patient taking differently: Take 50 mg by mouth daily as needed.)   omeprazole (PRILOSEC OTC) 20 MG tablet Take 1 tablet (20 mg total) by mouth daily for 14 days.   sildenafil (VIAGRA) 100 MG tablet Take 1 tablet (100 mg total) by mouth daily as needed for erectile dysfunction.   valsartan (DIOVAN) 160 MG tablet Take 1 tablet (160 mg total) by mouth daily.     Allergies:   Patient has no known allergies.   Social History   Socioeconomic History   Marital status: Divorced    Spouse name: Not on file   Number of children: Not on file   Years of education: Not on file   Highest education level: Not on file  Occupational History   Not on file  Tobacco Use   Smoking status: Every Day    Packs/day: 0.50    Years: 36.00    Total pack years: 18.00    Types: Cigarettes   Smokeless tobacco: Current    Types: Snuff   Tobacco comments:    7-8cig daily--08/31/2022 khj  Substance and Sexual Activity   Alcohol use:  Yes    Comment: occasionally   Drug use: No   Sexual activity: Not on file  Other Topics Concern   Not on file  Social History Narrative   Not on file   Social Determinants of Health   Financial Resource Strain: Not on file  Food Insecurity: Not on file  Transportation Needs: Not on file  Physical Activity: Not on file  Stress: Not on file  Social Connections: Not on file     Family History: The patient's family history includes Fibromyalgia in his mother; Healthy in his sister; Hypertension in his father; Prostate cancer in his father.  ROS:   Please see the history of present illness.     All other systems reviewed and are negative.  EKGs/Labs/Other Studies Reviewed:    The following studies were reviewed  today:   EKG:  EKG is  ordered today.  The ekg ordered today demonstrates sinus bradycardia, heart rate 58  Recent Labs: 08/07/2022: ALT 36 09/25/2022: BUN 23; Creatinine, Ser 1.23; Potassium 4.1; Sodium 138 10/24/2022: Hemoglobin 15.8; Platelets 301  Recent Lipid Panel    Component Value Date/Time   CHOL 161 08/07/2022 0839   TRIG 697 (HH) 08/07/2022 0839   HDL 23 (L) 08/07/2022 0839   CHOLHDL 7.0 (H) 08/07/2022 0839   LDLCALC 41 08/07/2022 0839     Risk Assessment/Calculations:     HYPERTENSION CONTROL Vitals:   11/20/22 1115 11/20/22 1116  BP: (!) 140/98 (!) 140/68    The patient's blood pressure is elevated above target today.  In order to address the patient's elevated BP: Blood pressure will be monitored at home to determine if medication changes need to be made.            Physical Exam:    VS:  BP (!) 140/68 (BP Location: Left Arm)   Pulse 62   Ht '5\' 10"'$  (1.778 m)   Wt 193 lb 12.8 oz (87.9 kg)   SpO2 98%   BMI 27.81 kg/m     Wt Readings from Last 3 Encounters:  11/20/22 193 lb 12.8 oz (87.9 kg)  11/09/22 192 lb (87.1 kg)  09/27/22 198 lb 9.6 oz (90.1 kg)     GEN:  Well nourished, well developed in no acute distress HEENT: Normal NECK: No JVD; No carotid bruits CARDIAC: RRR, no murmurs, rubs, gallops RESPIRATORY:  Clear to auscultation without rales, wheezing or rhonchi  ABDOMEN: Soft, non-tender, non-distended MUSCULOSKELETAL:  No edema; No deformity  SKIN: Warm and dry NEUROLOGIC:  Alert and oriented x 3 PSYCHIATRIC:  Normal affect   ASSESSMENT:    1. Precordial pain   2. Primary hypertension   3. Pure hypertriglyceridemia   4. Smoking     PLAN:    In order of problems listed above:  Chest pain, coronary CT with calcium score 0, no evidence of CAD.  Echo shows EF 60 to 65%.  Continue Prilosec 20 mg daily due to history of heartburn.  Chest pain resolved.  Patient made aware of results, reassured. Hypertension, BP elevated, usually  controlled likely from gout/leg pain today.  Continue amlodipine 5 mg daily, valsartan, atenolol.  Elevated triglycerides, continue fenofibrate, low-cholesterol diet.  Lipid panel in 6 weeks Current smoker, smoking cessation advised.  Follow-up in 3 months      Medication Adjustments/Labs and Tests Ordered: Current medicines are reviewed at length with the patient today.  Concerns regarding medicines are outlined above.  Orders Placed This Encounter  Procedures   Lipid panel  No orders of the defined types were placed in this encounter.   Patient Instructions  Medication Instructions:   Your physician recommends that you continue on your current medications as directed. Please refer to the Current Medication list given to you today.  *If you need a refill on your cardiac medications before your next appointment, please call your pharmacy*   Lab Work:  Your physician recommends that you return for lab work in: 6 weeks (around 12/03/2022) [facility] mall. You will need to be fasting. No appt is needed. Hours are M-F 7AM- 6 PM.  If you have labs (blood work) drawn today and your tests are completely normal, you will receive your results only by: [place] (if you have MyChart) OR A paper copy in the mail If you have any lab test that is abnormal or we need to change your treatment, we will call you to review the results.   Testing/Procedures:  None Ordered   Follow-Up: At [facility], you and your health needs are our priority.  As part of our continuing mission to provide you with exceptional heart care, we have created designated Provider Care Teams.  These Care Teams include your primary Cardiologist (physician) and Advanced Practice Providers (APPs -  Physician Assistants and Nurse Practitioners) who all work together to provide you with the care you need, when you need it.  We recommend signing up for the patient portal called "MyChart".  Sign up  information is provided on this After Visit Summary.  MyChart is used to connect with patients for Virtual Visits (Telemedicine).  Patients are able to view lab/test results, encounter notes, upcoming appointments, etc.  Non-urgent messages can be sent to your provider as well.   To learn more about what you can do with MyChart, go to [email or web address].    Your next appointment:   3 month(s)  Provider:   You may see Kate Sable, MD or one of the following Advanced Practice Providers on your designated Care Team:   Murray Hodgkins, NP Christell Faith, PA-C [place] Kathlen Mody, PA-C Gerrie Nordmann, NP   Signed, Kate Sable, MD  11/20/2022 12:59 PM    [place]

## 2022-11-21 ENCOUNTER — Ambulatory Visit: Payer: Self-pay

## 2022-11-21 NOTE — Telephone Encounter (Signed)
  Chief Complaint: Pain in foot - medication needed Symptoms: Pain Frequency: ongoing Pertinent Negatives: Patient denies  Disposition: '[]'$ ED /'[]'$ Urgent Care (no appt availability in office) / '[]'$ Appointment(In office/virtual)/ '[]'$  [facility]/ '[]'$ Home Care/ '[]'$ Refused Recommended Disposition /'[]'$ [place] [facility]/ '[x]'$  Follow-up with PCP Additional Notes: Pt states is still waiting for a response from Dr. Caryn Section regarding medication. PT states that as soon as he finished the steroid his foot started hurting again.  Please advise    Summary: pain in foot   Patient called back again waiting to hear if Dr Caryn Section is going to send him med, for pain in his foot.       Reason for Disposition  [1] Follow-up call to recent contact AND [2] information only call, no triage required  Answer Assessment - Initial Assessment Questions 1. REASON FOR CALL or QUESTION: "What is your reason for calling today?" or "How can I best help you?" or "What question do you have that I can help answer?"     PT is wondering when Dr. Caryn Section will get back to him regarding medication for his foot.  Protocols used: Information Only Call - No Triage-A-AH

## 2022-11-22 MED ORDER — ALLOPURINOL 300 MG PO TABS: 300.0000 mg | ORAL_TABLET | Freq: Every day | ORAL | 3 refills | Status: DC

## 2022-11-22 MED ORDER — COLCHICINE 0.6 MG PO TABS: ORAL_TABLET | ORAL | 3 refills | Status: DC

## 2022-11-22 NOTE — Telephone Encounter (Signed)
There is already a message in my nurse pool about this from 1/27. Please seen 11/18/2022 telephone message

## 2022-11-22 NOTE — Addendum Note (Signed)
Addended by: Barnie Mort on: 11/22/2022 08:51 AM   Modules accepted: Orders

## 2022-11-22 NOTE — Telephone Encounter (Signed)
Patient advised. Verbalized understanding 

## 2022-11-22 NOTE — Telephone Encounter (Signed)
Birdie Sons, MD      11/18/22  8:19 AM Note Need to increase allopurinol to '300mg'$  daily with goal of getting uric acid under 4.5.  also need to take colchicine once every day for the next month.  Please send in prescription for allopurinol '300mg'$  one tablet daily, #90 rf x 3. Please sent in refill for colchicine one tablet daily #30, rf x 3 Advise he needs to strictly avoid alcohol which is another trigger for gout

## 2022-12-28 ENCOUNTER — Ambulatory Visit: Payer: BC Managed Care – PPO | Admitting: Pulmonary Disease

## 2022-12-28 ENCOUNTER — Encounter: Payer: Self-pay | Admitting: Pulmonary Disease

## 2022-12-28 VITALS — BP 140/90 | HR 66 | Temp 98.1°F | Ht 70.0 in | Wt 196.2 lb

## 2022-12-28 DIAGNOSIS — J4489 Other specified chronic obstructive pulmonary disease: Secondary | ICD-10-CM | POA: Diagnosis not present

## 2022-12-28 DIAGNOSIS — F17210 Nicotine dependence, cigarettes, uncomplicated: Secondary | ICD-10-CM | POA: Diagnosis not present

## 2022-12-28 MED ORDER — LEVALBUTEROL TARTRATE 45 MCG/ACT IN AERO: 2.0000 | INHALATION_SPRAY | Freq: Four times a day (QID) | RESPIRATORY_TRACT | 12 refills | Status: DC | PRN

## 2022-12-28 MED ORDER — BREZTRI AEROSPHERE 160-9-4.8 MCG/ACT IN AERO: 2.0000 | INHALATION_SPRAY | Freq: Two times a day (BID) | RESPIRATORY_TRACT | 11 refills | Status: DC

## 2022-12-28 NOTE — Patient Instructions (Addendum)
I sent a prescription for your rescue inhaler it is a little different than the usual albuterol.  It is called Xopenex or levo albuterol.  It is used the same way as the other 1 was.  Let us know if there is any issues getting this medication.  We refilled your Judithann Sauger, remembers 2 puffs twice a day and make sure that you rinse your mouth well after you use it.  We ordered breathing tests before you come back to see [place].  Will see you in follow-up in 3 to 4 months time call sooner should any new problems arise.

## 2022-12-28 NOTE — Progress Notes (Signed)
Subjective:    Patient ID: Derek [name], male    DOB: 07/11/1966, 57 y.o.   MRN: [id number] Patient Care Team: Birdie Sons, MD as PCP - General (Family Medicine) Kate Sable, MD as PCP - Cardiology (Cardiology) Tyler Pita, MD as Consulting Physician (Pulmonary Disease)  Chief Complaint  Patient presents with   Follow-up    SOB sometimes at work. Some wheezing. No cough.    HPI Patient is a 57 year old current smoker (0.5 PPD,40 PY) who presents for follow-up on the issue of COPD and shortness of breath.  This is a scheduled visit.  Patient was initially seen by me on 03 May 2020, then he was evaluated by Rexene Edison, NP in February 2023.  He had been lost to follow-up between 2021- 2023.  At his prior visit in February he was noted to have an acute exacerbation of COPD he was treated for that and also referred to the lung cancer screening program.  Patient underwent lung cancer screening CT on 22 Feb 2022 and this was lung RADS 1, negative.  Was instructed to continue yearly CT. I last saw him on 31 August 2022 at that time he was switched to [facility] 2 puffs twice a day.  He notes improvement on this medication.   Dyspnea is at baseline though he feels that this is more manageable with Breztri.  Unfortunately continues to smoke.  We will need a refill on his rescue inhaler and will need alternative as his insurance is not covering albuterol at a reasonable price.  No recent changes on the character of his cough. No hemoptysis.  He has not had any chest pain, no orthopnea or paroxysmal nocturnal dyspnea.  No lower extremity edema.  No calf tenderness.  No fevers, chills or sweats.    DATA 05/11/2020 PFTs: Showed mild to moderate restriction with an FEV1 at 81%, or ratio 76, FVC 682%, no significant bronchodilator response, DLCO 69%. 02/22/2022 LDCT chest: Lung RADS 1, negative.   Review of Systems    Patient Active Problem List   Diagnosis Date Noted    Hyperuricemia 09/08/2022   GAD (generalized anxiety disorder) 08/01/2022   Hypertriglyceridemia 08/01/2022   Chest pain 08/01/2022   Left leg pain 08/01/2022   Hamstring muscle strain, right, initial encounter 05/02/2022   Aortic atherosclerosis ([place]) 02/24/2022   COPD (chronic obstructive pulmonary disease) ([place]) 12/16/2021   Asthma 05/16/2021   Adrenal adenoma, right 01/03/2016   Cervical nerve root disorder 01/03/2016   Displacement of cervical intervertebral disc without myelopathy 01/03/2016   Erectile dysfunction 01/03/2016   Family history of malignant neoplasm of prostate 01/03/2016   H/O arthrodesis 01/03/2016   Gout 01/03/2016   Headache, migraine 01/03/2016   Calculus of kidney 01/03/2016   Backhand tennis elbow 01/03/2016   Disordered sleep 06/23/2014   B12 deficiency 06/23/2014   Degeneration of cervical intervertebral disc 01/25/2010   Lipoma of skin 07/21/2009   Primary hypertension 11/25/2007   Awareness of heartbeats 11/25/2007   Tobacco use 11/25/2007   Social History   Tobacco Use   Smoking status: Every Day    Packs/day: 0.50    Years: 36.00    Total pack years: 18.00    Types: Cigarettes   Smokeless tobacco: Current    Types: Snuff   Tobacco comments:    0.5 PPD 12/28/2022 khj  Substance Use Topics   Alcohol use: Yes    Comment: occasionally   No Known Allergies Current Meds  Medication Sig  albuterol (VENTOLIN HFA) 108 (90 Base) MCG/ACT inhaler TAKE 2 PUFFS BY MOUTH EVERY 6 HOURS AS NEEDED FOR WHEEZE OR SHORTNESS OF BREATH   allopurinol (ZYLOPRIM) 300 MG tablet Take 1 tablet (300 mg total) by mouth daily.   amLODipine (NORVASC) 5 MG tablet Take 1 tablet (5 mg total) by mouth daily.   atenolol (TENORMIN) 50 MG tablet TAKE 1 TABLET BY MOUTH EVERY DAY   Budeson-Glycopyrrol-Formoterol (BREZTRI AEROSPHERE) 160-9-4.8 MCG/ACT AERO Inhale 2 puffs into the lungs in the morning and at bedtime.   citalopram (CELEXA) 40 MG tablet TAKE 1 TABLET BY MOUTH  EVERY DAY   colchicine 0.6 MG tablet Take one tablet daily   fluticasone (FLONASE) 50 MCG/ACT nasal spray Place 2 sprays into both nostrils daily.   naltrexone (DEPADE) 50 MG tablet Take 1 tablet (50 mg total) by mouth daily. (Patient taking differently: Take 50 mg by mouth daily as needed.)   sildenafil (VIAGRA) 100 MG tablet Take 1 tablet (100 mg total) by mouth daily as needed for erectile dysfunction.   valsartan (DIOVAN) 160 MG tablet Take 1 tablet (160 mg total) by mouth daily.   Immunization History  Administered Date(s) Administered   Hepatitis B 11/07/2006, 12/10/2006   Influenza,inj,Quad PF,6+ Mos 08/19/2019, 08/01/2022   Influenza-Unspecified 08/13/2018, 08/01/2021   Janssen (J&J) SARS-COV-2 Vaccination 02/02/2020   Tdap 03/05/2009, 01/08/2018       Objective:   Physical Exam BP (!) 140/90 (BP Location: Left Arm, Cuff Size: Normal)   Pulse 66   Temp 98.1 F (36.7 C)   Ht '5\' 10"'$  (1.778 m)   Wt 196 lb 3.2 oz (89 kg)   SpO2 98%   BMI 28.15 kg/m   SpO2: 98 % O2 Device: None (Room air)   GENERAL: Well-developed, well-nourished gentleman, no acute distress, fully ambulatory no conversational dyspnea. HEAD: Normocephalic, atraumatic.  EYES: Pupils equal, round, reactive to light.  No scleral icterus.  MOUTH: Poor dentition, oral mucosa moist, no thrush. NECK: Supple. No thyromegaly. Trachea midline. No JVD.  No adenopathy. PULMONARY: Good air entry bilaterally.  No adventitious sounds. CARDIOVASCULAR: S1 and S2. Regular rate and rhythm.  No rubs, murmurs or gallops heard. ABDOMEN: Benign. MUSCULOSKELETAL: No joint deformity, no clubbing, no edema.  NEUROLOGIC: No overt focal deficit, no gait disturbance, speech is fluent. SKIN: Intact,warm,dry. PSYCH: Mood and behavior normal.     Assessment & Plan:     ICD-10-CM   1. Asthma-COPD overlap syndrome  J44.89 Pulmonary Function Test [facility] Only   Continue Breztri 2 puffs twice a day Switch albuterol to levo albuterol  for rescue    2. Tobacco dependence due to cigarettes  F17.210    Patient counseled regards to discontinuation of smoking Total counseling time 3 to 5 minutes Continue on lung cancer screening program     Orders Placed This Encounter  Procedures   Pulmonary Function Test [facility] Only    Standing Status:   Future    Standing Expiration Date:   06/30/2023    Order Specific Question:   Full PFT: includes the following: basic spirometry, spirometry pre & post bronchodilator, diffusion capacity (DLCO), lung volumes    Answer:   Full PFT    Order Specific Question:   This test can only be performed at    Answer:   [facility] ordered this encounter  Medications   levalbuterol (XOPENEX HFA) 45 MCG/ACT inhaler    Sig: Inhale 2 puffs into the lungs every 6 (six) hours as needed for  wheezing or shortness of breath.    Dispense:  1 each    Refill:  12   Budeson-Glycopyrrol-Formoterol (BREZTRI AEROSPHERE) 160-9-4.8 MCG/ACT AERO    Sig: Inhale 2 puffs into the lungs in the morning and at bedtime.    Dispense:  10.7 g    Refill:  11    Order Specific Question:   Lot Number?    Answer:   [id number]    Order Specific Question:   Expiration Date?    Answer:   01/21/2025    Order Specific Question:   Manufacturer?    Answer:   [employer] [[id number]]    Order Specific Question:   Quantity    Answer:   1   Will reassess the patient's lung function with PFTs prior to follow-up visit.  Patient will be seen in follow-up on 3 to 4 months time call sooner should any new problems arise.  Renold Don, MD Advanced Bronchoscopy PCCM [facility]    *This note was dictated using voice recognition software/Dragon.  Despite best efforts to proofread, errors can occur which can change the meaning. Any transcriptional errors that result from this process are unintentional and may not be fully corrected at the time of dictation.

## 2023-01-03 ENCOUNTER — Other Ambulatory Visit: Payer: Self-pay | Admitting: Family Medicine

## 2023-01-03 ENCOUNTER — Ambulatory Visit: Payer: Self-pay

## 2023-01-03 ENCOUNTER — Other Ambulatory Visit: Payer: Self-pay

## 2023-01-03 DIAGNOSIS — F411 Generalized anxiety disorder: Secondary | ICD-10-CM

## 2023-01-03 MED ORDER — ALLOPURINOL 300 MG PO TABS: 300.0000 mg | ORAL_TABLET | Freq: Every day | ORAL | 3 refills | Status: DC

## 2023-01-03 NOTE — Telephone Encounter (Signed)
Pt. States pharmacy never received refill in January for Allopurinol.Attempted to send electronically, unable to. Unable to reach pharmacy by phone. Please advise.

## 2023-01-03 NOTE — Telephone Encounter (Signed)
Requested Prescriptions  Pending Prescriptions Disp Refills   citalopram (CELEXA) 40 MG tablet [Pharmacy Med Name: CITALOPRAM HBR 40 MG TABLET] 90 tablet 1    Sig: TAKE 1 TABLET BY MOUTH EVERY DAY     Psychiatry:  Antidepressants - SSRI Passed - 01/03/2023  1:53 AM      Passed - Valid encounter within last 6 months    Recent Outpatient Visits           1 month ago Acute gout of right foot, unspecified cause   [facility] Thedore Mins, [place], PA-C   1 month ago Gout, unspecified cause, unspecified chronicity, unspecified site   [facility] Thedore Mins, [place], PA-C   3 months ago Primary hypertension   [place], Donald E, MD   5 months ago Annual physical exam   [facility] Mikey Kirschner, PA-C   8 months ago Hamstring muscle strain, right, initial encounter   [facility] Mikey Kirschner, PA-C       Future Appointments             In 3 weeks Tyler Pita, MD [facility] [place]   In 1 month Agbor-Etang, Aaron Edelman, MD [place] at [facility]

## 2023-01-04 ENCOUNTER — Other Ambulatory Visit: Payer: Self-pay

## 2023-01-04 MED ORDER — ALLOPURINOL 300 MG PO TABS: 300.0000 mg | ORAL_TABLET | Freq: Every day | ORAL | 3 refills | Status: AC

## 2023-01-04 MED ORDER — FENOFIBRATE 145 MG PO TABS: 145.0000 mg | ORAL_TABLET | Freq: Every day | ORAL | 0 refills | Status: DC

## 2023-01-04 NOTE — Telephone Encounter (Signed)
    Chief Complaint: Pt. Seen 11/09/22 with gout flare up. On 11/18/22 Dr. Caryn Section increased Allopurinol. Prescription shows in chart as "phone in".Pt. states he does not have new dose. Attempted to send electronically and was unsuccessful. Called [facility] and was on hold 30 minutes. Will forward to the practice. Pt. Declines OV. "I just need my refill." Symptoms: Pain great toe Frequency: 1 week Pertinent Negatives: Patient denies  Disposition: [] ED /[] Urgent Care (no appt availability in office) / [] Appointment(In office/virtual)/ []  [place] [facility]/ [] Home Care/ [] Refused Recommended Disposition /[] [place] [facility]/ [x]  Follow-up with PCP Additional Notes: Please advise pt.  Answer Assessment - Initial Assessment Questions 1. DRUG NAME: "What medicine do you need to have refilled?"     Allopurinol  2. REFILLS REMAINING: "How many refills are remaining?" (Note: The label on the medicine or pill bottle will show how many refills are remaining. If there are no refills remaining, then a renewal may be needed.)     0 3. EXPIRATION DATE: "What is the expiration date?" (Note: The label states when the prescription will expire, and thus can no longer be refilled.)     N/a 4. PRESCRIBING HCP: "Who prescribed it?" Reason: If prescribed by specialist, call should be referred to that group.     Dr. Caryn Section 5. SYMPTOMS: "Do you have any symptoms?"     Gout flare up 6. PREGNANCY: "Is there any chance that you are pregnant?" "When was your last menstrual period?"     N/a  Protocols used: Medication Refill and Renewal Call-A-AH

## 2023-01-23 ENCOUNTER — Ambulatory Visit: Payer: BC Managed Care – PPO | Attending: Pulmonary Disease

## 2023-01-23 DIAGNOSIS — J4489 Other specified chronic obstructive pulmonary disease: Secondary | ICD-10-CM | POA: Insufficient documentation

## 2023-01-23 DIAGNOSIS — F17210 Nicotine dependence, cigarettes, uncomplicated: Secondary | ICD-10-CM | POA: Insufficient documentation

## 2023-01-23 LAB — PULMONARY FUNCTION TEST ARMC ONLY
DL/VA % pred: 79 %
DL/VA: 3.46 ml/min/mmHg/L
DLCO unc % pred: 74 %
DLCO unc: 20.37 ml/min/mmHg
FEF 25-75 Post: 2.78 L/sec
FEF 25-75 Pre: 1.72 L/sec
FEF2575-%Change-Post: 61 %
FEF2575-%Pred-Post: 90 %
FEF2575-%Pred-Pre: 55 %
FEV1-%Change-Post: 24 %
FEV1-%Pred-Post: 76 %
FEV1-%Pred-Pre: 61 %
FEV1-Post: 2.78 L
FEV1-Pre: 2.23 L
FEV1FVC-%Change-Post: 11 %
FEV1FVC-%Pred-Pre: 85 %
FEV6-%Change-Post: 15 %
FEV6-%Pred-Post: 83 %
FEV6-%Pred-Pre: 72 %
FEV6-Post: 3.77 L
FEV6-Pre: 3.27 L
FEV6FVC-%Pred-Post: 104 %
FEV6FVC-%Pred-Pre: 104 %
FVC-%Change-Post: 11 %
FVC-%Pred-Post: 80 %
FVC-%Pred-Pre: 72 %
FVC-Post: 3.82 L
FVC-Pre: 3.42 L
Post FEV1/FVC ratio: 73 %
Post FEV6/FVC ratio: 100 %
Pre FEV1/FVC ratio: 65 %
Pre FEV6/FVC Ratio: 100 %
RV % pred: 161 %
RV: 3.41 L
TLC % pred: 104 %
TLC: 7.1 L

## 2023-01-23 MED ORDER — ALBUTEROL SULFATE (2.5 MG/3ML) 0.083% IN NEBU: 2.5000 mg | INHALATION_SOLUTION | Freq: Once | RESPIRATORY_TRACT | Status: AC

## 2023-01-30 ENCOUNTER — Encounter: Payer: Self-pay | Admitting: Pulmonary Disease

## 2023-01-30 ENCOUNTER — Ambulatory Visit: Payer: BC Managed Care – PPO | Admitting: Pulmonary Disease

## 2023-01-30 VITALS — BP 134/88 | HR 60 | Temp 98.1°F | Ht 70.0 in | Wt 199.8 lb

## 2023-01-30 DIAGNOSIS — F17210 Nicotine dependence, cigarettes, uncomplicated: Secondary | ICD-10-CM | POA: Diagnosis not present

## 2023-01-30 DIAGNOSIS — J4489 Other specified chronic obstructive pulmonary disease: Secondary | ICD-10-CM | POA: Diagnosis not present

## 2023-01-30 MED ORDER — VARENICLINE TARTRATE (STARTER) 0.5 MG X 11 & 1 MG X 42 PO TBPK: ORAL_TABLET | ORAL | 0 refills | Status: DC

## 2023-01-30 MED ORDER — VARENICLINE TARTRATE 1 MG PO TABS: ORAL_TABLET | ORAL | 1 refills | Status: DC

## 2023-01-30 NOTE — Progress Notes (Signed)
Subjective:    Patient ID: Derek [name], male    DOB: 06/05/1966, 57 y.o.   MRN: [id number] Patient Care Team: Malva Limes, MD as PCP - General (Family Medicine) Debbe Odea, MD as PCP - Cardiology (Cardiology) Salena Saner, MD as Consulting Physician (Pulmonary Disease)  Chief Complaint  Patient presents with   Follow-up    No SOB or cough. A little wheezing.   HPI Patient is a 57 year old current smoker (0.5 PPD,40 PY) who presents for follow-up on the issue of COPD and shortness of breath.  This is a scheduled visit.  I last saw the patient on 28 December 2022.  At that visit his rescue albuterol was switched to levo albuterol due to coverage from his insurance company.  He notes that since he has been on [place] he has not needed use of levo albuterol much.  Dyspnea is at baseline though he feels that this is more manageable with Breztri.  He has had no recent changes on the character of his cough. No hemoptysis.  He has not had any chest pain, no orthopnea or paroxysmal nocturnal dyspnea.  No lower extremity edema.  No calf tenderness.  No fevers, chills or sweats.  He had pulmonary function testing performed 23 January 2023 and this showed that his lung function has declined somewhat.  This was discussed with the patient.  We discussed that he needs to stop smoking.  He is enrolled in lung cancer screening and will have his next imaging in May.  Today he presents with no issues with fevers, chills or sweats.  He has daily cough productive of whitish sputum in the mornings.  The rest of the day no cough of note.  He notices wheezing on occasion.  Has had no orthopnea, no paroxysmal nocturnal dyspnea.  No lower extremity edema or calf tenderness.  He does not endorse any other symptomatology.   DATA 05/11/2020 PFTs: FEV1 3.71 L or 78% predicted, FVC 4.81 L or 76% predicted, FEV1/FVC 77%.  No significant bronchodilator response.  Lung volumes at low end of normal, diffusion  capacity mildly impaired. 02/22/2022 LDCT chest: Lung RADS 1, negative. 01/23/2023 PFTs: FEV1 2.23 L or 61% predicted, FVC 3.42 L or 72% predicted, FEV1/FVC 65%.  There is a significant bronchodilator response with FEV1 improving to 2.78 L (24% net change) postbronchodilator, this is 76% of predicted.  Lung volumes show air trapping, diffusion capacity is mildly reduced.  This is consistent with moderate obstructive airways disease with reversible component.   Review of Systems A 10 point review of systems was performed and it is as noted above otherwise negative.  Patient Active Problem List   Diagnosis Date Noted   Hyperuricemia 09/08/2022   GAD (generalized anxiety disorder) 08/01/2022   Hypertriglyceridemia 08/01/2022   Chest pain 08/01/2022   Left leg pain 08/01/2022   Hamstring muscle strain, right, initial encounter 05/02/2022   Aortic atherosclerosis 02/24/2022   COPD (chronic obstructive pulmonary disease) 12/16/2021   Asthma 05/16/2021   Adrenal adenoma, right 01/03/2016   Cervical nerve root disorder 01/03/2016   Displacement of cervical intervertebral disc without myelopathy 01/03/2016   Erectile dysfunction 01/03/2016   Family history of malignant neoplasm of prostate 01/03/2016   H/O arthrodesis 01/03/2016   Gout 01/03/2016   Headache, migraine 01/03/2016   Calculus of kidney 01/03/2016   Backhand tennis elbow 01/03/2016   Disordered sleep 06/23/2014   B12 deficiency 06/23/2014   Degeneration of cervical intervertebral disc 01/25/2010   Lipoma  of skin 07/21/2009   Primary hypertension 11/25/2007   Awareness of heartbeats 11/25/2007   Tobacco use 11/25/2007   Social History   Tobacco Use   Smoking status: Every Day    Packs/day: 0.50    Years: 36.00    Additional pack years: 0.00    Total pack years: 18.00    Types: Cigarettes   Smokeless tobacco: Current    Types: Snuff   Tobacco comments:    8 cigarettes daily 01/30/2023 khj  Substance Use Topics    Alcohol use: Yes    Comment: occasionally   No Known Allergies Current Meds  Medication Sig   allopurinol (ZYLOPRIM) 300 MG tablet Take 1 tablet (300 mg total) by mouth daily.   amLODipine (NORVASC) 5 MG tablet Take 1 tablet (5 mg total) by mouth daily.   atenolol (TENORMIN) 50 MG tablet TAKE 1 TABLET BY MOUTH EVERY DAY   Budeson-Glycopyrrol-Formoterol (BREZTRI AEROSPHERE) 160-9-4.8 MCG/ACT AERO Inhale 2 puffs into the lungs in the morning and at bedtime.   citalopram (CELEXA) 40 MG tablet TAKE 1 TABLET BY MOUTH EVERY DAY   colchicine 0.6 MG tablet Take one tablet daily   fenofibrate (TRICOR) 145 MG tablet Take 1 tablet (145 mg total) by mouth daily.   fluticasone (FLONASE) 50 MCG/ACT nasal spray Place 2 sprays into both nostrils daily.   levalbuterol (XOPENEX HFA) 45 MCG/ACT inhaler Inhale 2 puffs into the lungs every 6 (six) hours as needed for wheezing or shortness of breath.   naltrexone (DEPADE) 50 MG tablet Take 1 tablet (50 mg total) by mouth daily. (Patient taking differently: Take 50 mg by mouth daily as needed.)   sildenafil (VIAGRA) 100 MG tablet Take 1 tablet (100 mg total) by mouth daily as needed for erectile dysfunction.   valsartan (DIOVAN) 160 MG tablet Take 1 tablet (160 mg total) by mouth daily.   Immunization History  Administered Date(s) Administered   Hepatitis B 11/07/2006, 12/10/2006   Influenza,inj,Quad PF,6+ Mos 08/19/2019, 08/01/2022   Influenza-Unspecified 08/13/2018, 08/01/2021   Janssen (J&J) SARS-COV-2 Vaccination 02/02/2020   Tdap 03/05/2009, 01/08/2018       Objective:   Physical Exam BP 134/88 (BP Location: Left Arm, Cuff Size: Large)   Pulse 60   Temp 98.1 F (36.7 C)   Ht 5\' 10"  (1.778 m)   Wt 199 lb 12.8 oz (90.6 kg)   SpO2 99%   BMI 28.67 kg/m   SpO2: 99 % O2 Device: None (Room air)  GENERAL: Well-developed, well-nourished gentleman, no acute distress, fully ambulatory no conversational dyspnea. HEAD: Normocephalic, atraumatic.   EYES: Pupils equal, round, reactive to light.  No scleral icterus.  MOUTH: Poor dentition, oral mucosa moist, no thrush. NECK: Supple. No thyromegaly. Trachea midline. No JVD.  No adenopathy. PULMONARY: Good air entry bilaterally.  Coarse, otherwise, no adventitious sounds. CARDIOVASCULAR: S1 and S2. Regular rate and rhythm.  No rubs, murmurs or gallops heard. ABDOMEN: Benign. MUSCULOSKELETAL: No joint deformity, no clubbing, no edema.  NEUROLOGIC: No overt focal deficit, no gait disturbance, speech is fluent. SKIN: Intact,warm,dry. PSYCH: Mood and behavior normal.      Assessment & Plan:     ICD-10-CM   1. Asthma-COPD overlap syndrome  J44.89    Patient appears to be well compensated at present Continue Breztri 2 puffs twice a day Continue as needed albuterol    2. Tobacco dependence due to cigarettes  F17.210    Patient was counseled regards discontinuation of smoking Chantix for smoking cessation  Meds ordered this encounter  Medications   Varenicline Tartrate, Starter, (CHANTIX STARTING MONTH PAK) 0.5 MG X 11 & 1 MG X 42 TBPK    Sig: Take as directed in the package.    Dispense:  53 each    Refill:  0   varenicline (CHANTIX CONTINUING MONTH PAK) 1 MG tablet    Sig: Take as directed in the package.    Dispense:  56 tablet    Refill:  1   Will see the patient in follow-up in 2 to 3 months time he is to contact us prior to that time should any new difficulties arise.  Gailen Shelter. Laura Jericho Alcorn, MD Advanced Bronchoscopy PCCM [facility]    *This note was dictated using voice recognition software/Dragon.  Despite best efforts to proofread, errors can occur which can change the meaning. Any transcriptional errors that result from this process are unintentional and may not be fully corrected at the time of dictation.

## 2023-01-30 NOTE — Patient Instructions (Addendum)
You have enough refills on your pharmacy for the rescue inhaler.  All you have to do is call in for refill.  The prescription was placed in March.  Continue using your Breztri 2 puffs twice a day.  I have sent in a prescription for Chantix to assist with quitting smoking.  You will have 2 different prescriptions the first 1 is for the starter pack and then a month later you will start the continuing pack.  We will see you in follow-up in 2 to 3 months time call sooner should any new problems arise.

## 2023-02-12 ENCOUNTER — Telehealth: Payer: Self-pay

## 2023-02-12 NOTE — Telephone Encounter (Signed)
Spoke with patient.  Reminded him to get fasting labs prior to his appot 02/19/2023 and where to go for those.   He was thankful for the call.

## 2023-02-19 ENCOUNTER — Ambulatory Visit: Payer: BC Managed Care – PPO | Attending: Cardiology | Admitting: Cardiology

## 2023-02-20 ENCOUNTER — Encounter: Payer: Self-pay | Admitting: Cardiology

## 2023-02-21 ENCOUNTER — Other Ambulatory Visit: Payer: Self-pay

## 2023-02-21 MED ORDER — VARENICLINE TARTRATE 1 MG PO TABS: ORAL_TABLET | ORAL | 0 refills | Status: DC

## 2023-02-24 IMAGING — CT CT CHEST LUNG CANCER SCREENING LOW DOSE W/O CM
2 of 5 series · 15 of 40 positions shown, 18 images · non-contrast
Comparison: None Available.

CLINICAL DATA: Current smoker with 30 pack-year history



[Series 4: lung 1.00 · axial · 0.72mm/px · z∈[-1266,-939]mm · 12 of 361 slices shown, 15 images]
[im 17/361  mediastinal]
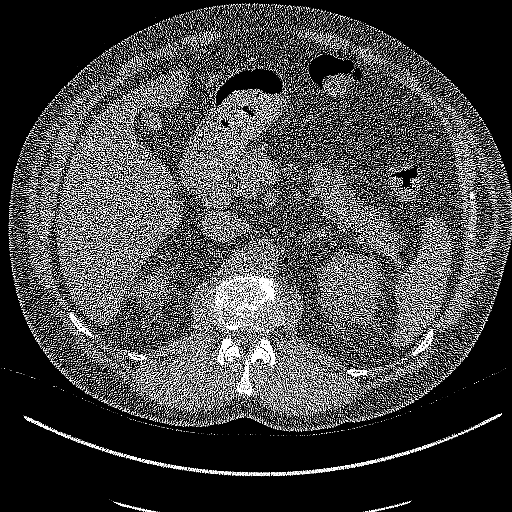
[im 17/361  lung]
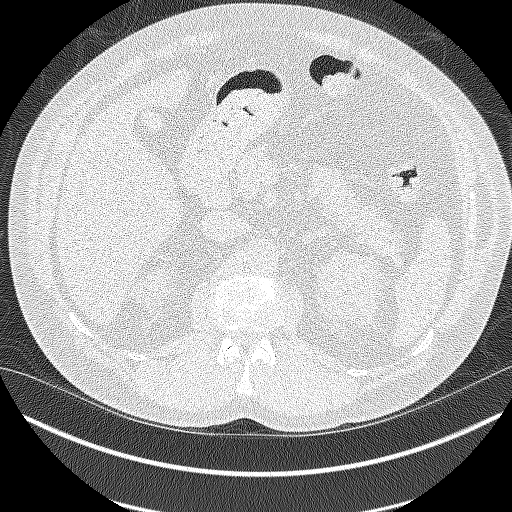
[im 50/361  lung]
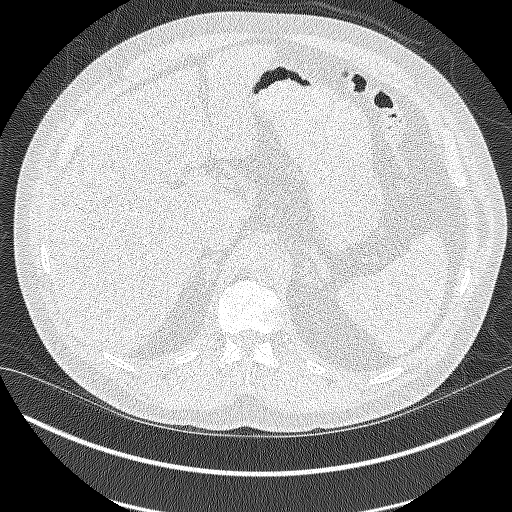
[im 82/361  lung]
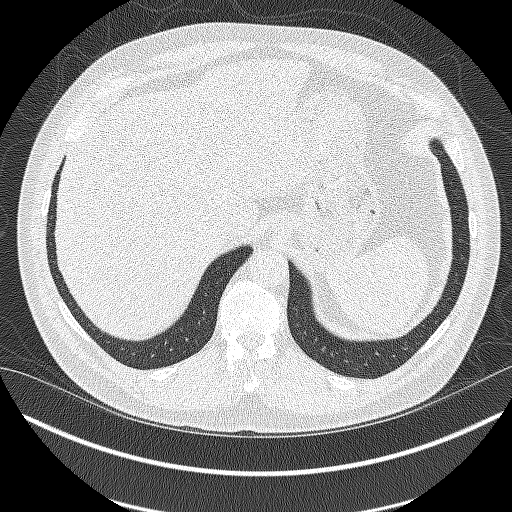
[im 115/361  lung]
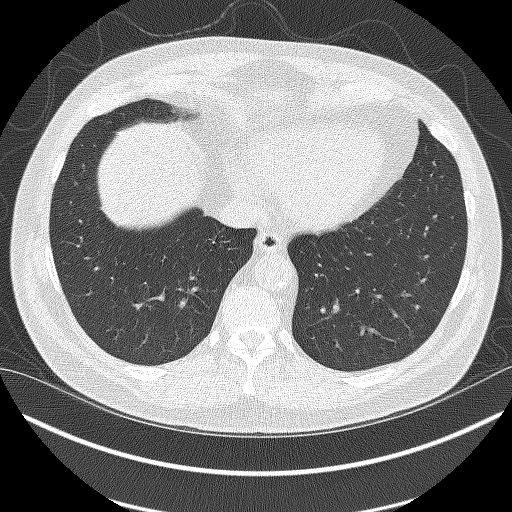
[im 131/361  mediastinal]
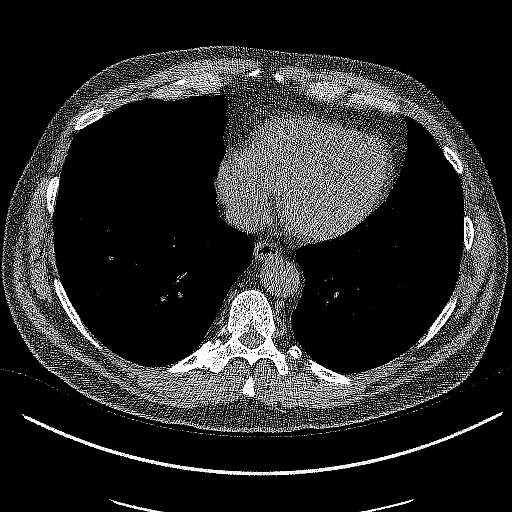
[im 131/361  lung]
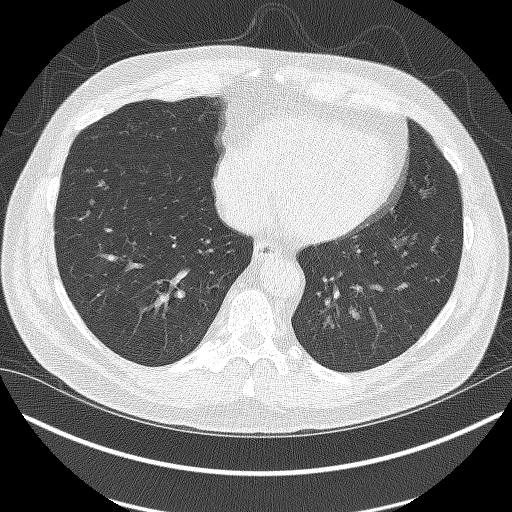
[im 164/361  lung]
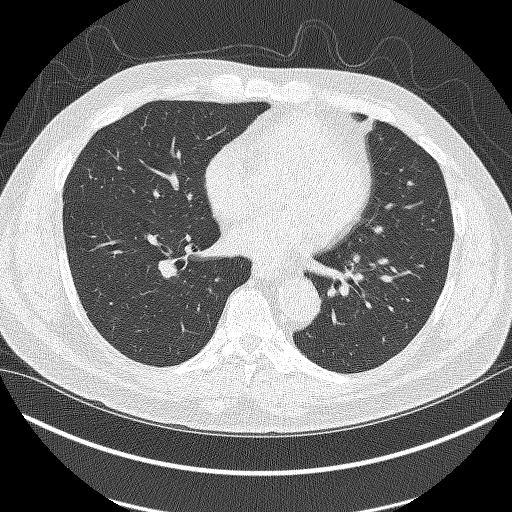
[im 197/361  lung]
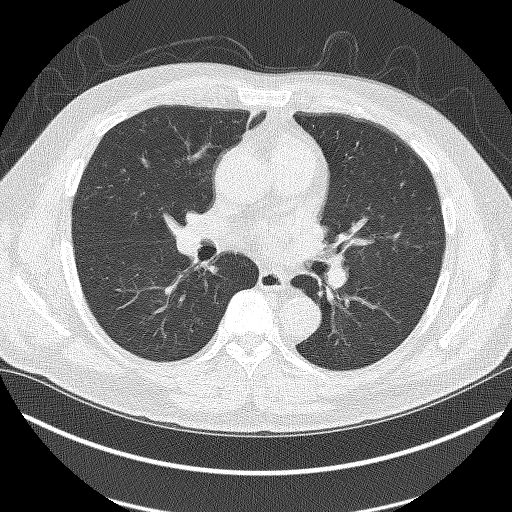
[im 230/361  lung]
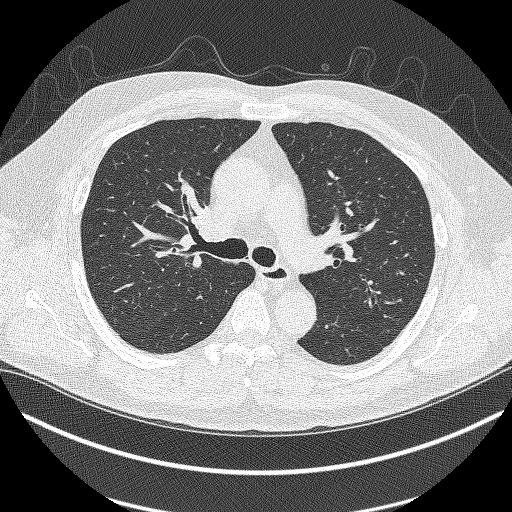
[im 246/361  mediastinal]
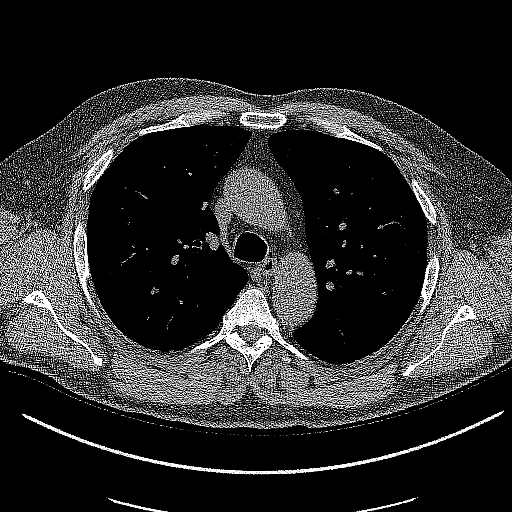
[im 246/361  lung]
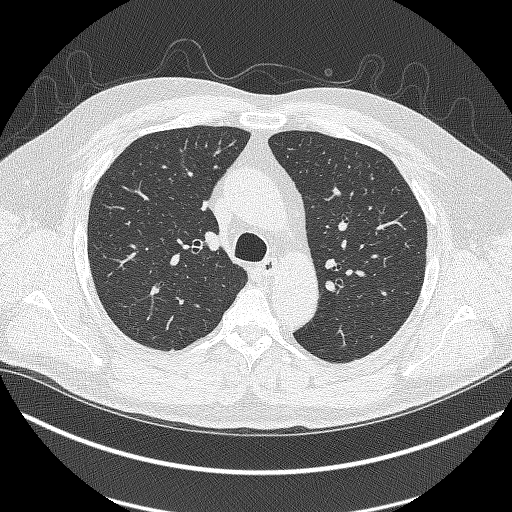
[im 279/361  lung]
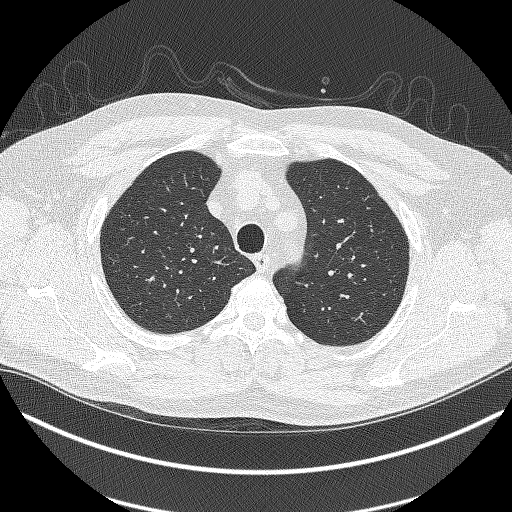
[im 311/361  lung]
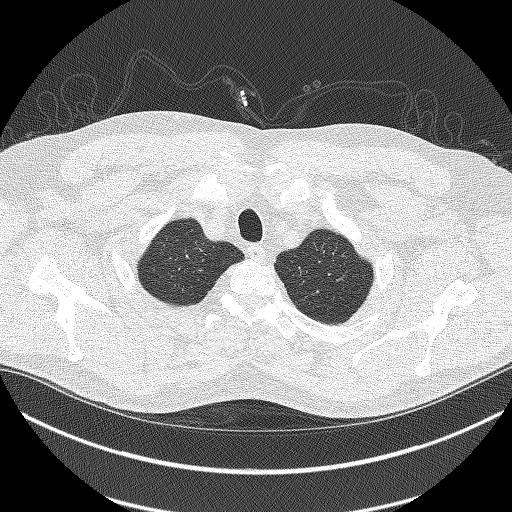
[im 344/361  lung]
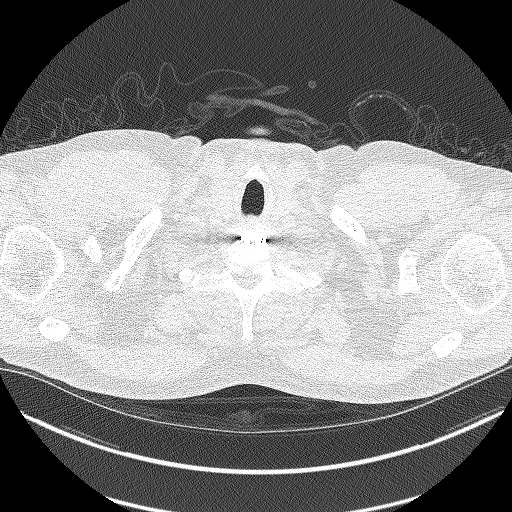

[Series 6: coronals lung 1.00 cor · coronal · 0.71mm/px · 3 of 368 slices shown]
[im 74/368  lung]
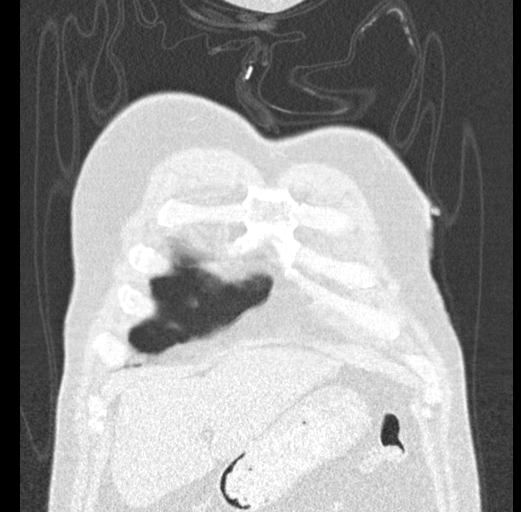
[im 147/368  lung]
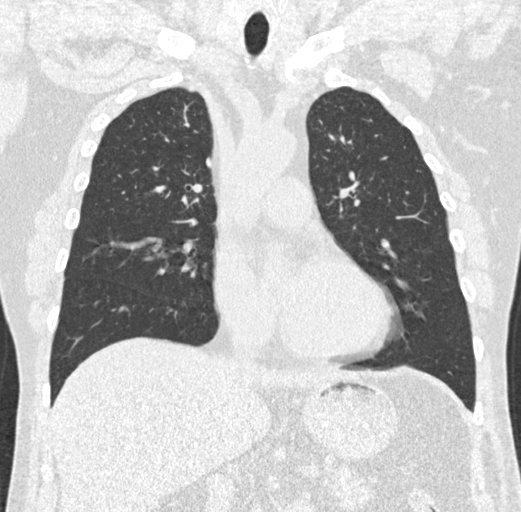
[im 221/368  lung]
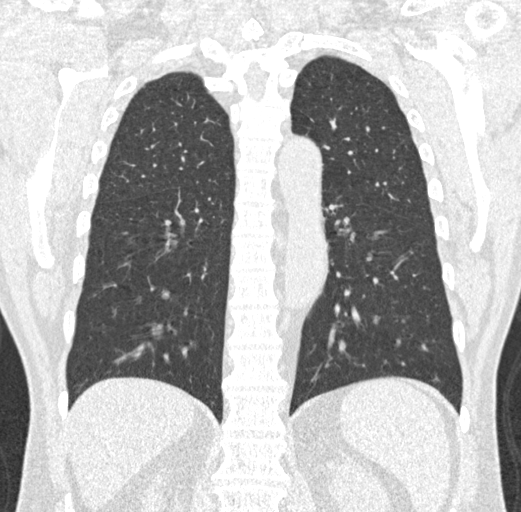

[15 of 40 positions shown; findings below may reference images not displayed]

FINDINGS: Cardiovascular: Normal heart size. No pericardial effusion.
Atherosclerotic disease of the thoracic aorta.

Mediastinum/Nodes: Esophagus and thyroid are unremarkable. No
pathologically enlarged lymph nodes seen in the chest.

Lungs/Pleura: Central airways are patent. No consolidation, pleural
effusion or pneumothorax.

Upper Abdomen: No acute abnormality.

Musculoskeletal: No chest wall mass or suspicious bone lesions
identified.
IMPRESSION: 1. Lung-RADS 1, negative. Continue annual screening with low-dose
chest CT without contrast in 12 months.
2. Aortic Atherosclerosis ([id number].S).

## 2023-02-25 ENCOUNTER — Other Ambulatory Visit: Payer: Self-pay | Admitting: Physician Assistant

## 2023-02-25 DIAGNOSIS — I10 Essential (primary) hypertension: Secondary | ICD-10-CM

## 2023-02-26 ENCOUNTER — Ambulatory Visit: Admission: RE | Admit: 2023-02-26 | Payer: BC Managed Care – PPO | Source: Ambulatory Visit

## 2023-03-13 ENCOUNTER — Ambulatory Visit (INDEPENDENT_AMBULATORY_CARE_PROVIDER_SITE_OTHER): Payer: Self-pay | Admitting: Physician Assistant

## 2023-03-13 VITALS — BP 114/72 | HR 67 | Temp 97.6°F | Resp 16 | Wt 192.4 lb

## 2023-03-13 DIAGNOSIS — H8392 Unspecified disease of left inner ear: Secondary | ICD-10-CM

## 2023-03-13 MED ORDER — METHYLPREDNISOLONE 4 MG PO TBPK
ORAL_TABLET | ORAL | 0 refills | Status: DC
Start: 2023-03-13 — End: 2024-03-07

## 2023-03-13 NOTE — Progress Notes (Signed)
[facility] [address] [place], [place] [address]   Office Visit Note  Patient Name: Derek [name] Date of Birth [id number]  Medical Record number [id number]  Date of Service: 03/13/2023  Chief Complaint  Patient presents with   Ear Pain    Left x2 weeks     57 y/o M presents to the clinic for c/o L ear pain x 2 weeks. Denies dizziness, headaches, ear d/c, nausea or vomiting. No ear trauma. NO open water activities. NO air travel recently. No URI symptoms except feeling slight congestion. Sometimes pain with chewing. No h/o TMJ.       Current Medication:  Outpatient Encounter Medications as of 03/13/2023  Medication Sig   allopurinol (ZYLOPRIM) 300 MG tablet Take 1 tablet (300 mg total) by mouth daily.   amLODipine (NORVASC) 5 MG tablet Take 1 tablet (5 mg total) by mouth daily.   Budeson-Glycopyrrol-Formoterol (BREZTRI AEROSPHERE) 160-9-4.8 MCG/ACT AERO Inhale 2 puffs into the lungs in the morning and at bedtime.   citalopram (CELEXA) 40 MG tablet TAKE 1 TABLET BY MOUTH EVERY DAY   fenofibrate (TRICOR) 145 MG tablet Take 1 tablet (145 mg total) by mouth daily.   fluticasone (FLONASE) 50 MCG/ACT nasal spray Place 2 sprays into both nostrils daily.   levalbuterol (XOPENEX HFA) 45 MCG/ACT inhaler Inhale 2 puffs into the lungs every 6 (six) hours as needed for wheezing or shortness of breath.   methylPREDNISolone (MEDROL DOSEPAK) 4 MG TBPK tablet Take as directed   sildenafil (VIAGRA) 100 MG tablet Take 1 tablet (100 mg total) by mouth daily as needed for erectile dysfunction.   valsartan (DIOVAN) 160 MG tablet TAKE 1 TABLET BY MOUTH EVERY DAY   varenicline (CHANTIX CONTINUING MONTH PAK) 1 MG tablet Take as directed in the package.   atenolol (TENORMIN) 50 MG tablet TAKE 1 TABLET BY MOUTH EVERY DAY   colchicine 0.6 MG tablet Take one tablet daily (Patient not taking: Reported on 03/13/2023)   indomethacin (INDOCIN) 25 MG capsule Take 1 capsule (25 mg total) by mouth 3  (three) times daily with meals. (Patient not taking: Reported on 11/20/2022)   indomethacin (INDOCIN) 50 MG capsule Take 1 capsule (50 mg total) by mouth 3 (three) times daily with meals. For first 4 days then following days take 25mg  capsules as prescribed. (Patient not taking: Reported on 11/20/2022)   naltrexone (DEPADE) 50 MG tablet Take 1 tablet (50 mg total) by mouth daily. (Patient not taking: Reported on 03/13/2023)   omeprazole (PRILOSEC OTC) 20 MG tablet Take 1 tablet (20 mg total) by mouth daily for 14 days.   [DISCONTINUED] amoxicillin-clavulanate (AUGMENTIN) 875-125 MG tablet Take 1 tablet by mouth 2 (two) times daily. (Patient not taking: Reported on 11/20/2022)   [DISCONTINUED] baclofen (LIORESAL) 10 MG tablet Take 1 tablet (10 mg total) by mouth 3 (three) times daily. (Patient not taking: Reported on 11/20/2022)   [DISCONTINUED] predniSONE (DELTASONE) 10 MG tablet Take 40 mg for 3-4 days until symptoms improve and then taper down, 30 mg for 2 days, 20 mg for 2 days, 10 mg for 2 days and then stop (Patient not taking: Reported on 11/20/2022)   Facility-Administered Encounter Medications as of 03/13/2023  Medication   albuterol (PROVENTIL) (2.5 MG/3ML) 0.083% nebulizer solution 2.5 mg      Medical History: Past Medical History:  Diagnosis Date   Gout    Hypertension      Vital Signs: BP 114/72   Pulse 67   Temp 97.6  F (36.4 C) (Tympanic)   Resp 16   Wt 192 lb 6.4 oz (87.3 kg)   SpO2 99%   BMI 27.61 kg/m    Review of Systems  Constitutional: Negative.   HENT:  Positive for congestion and ear pain. Negative for ear discharge, postnasal drip, sinus pressure, sinus pain, sore throat and trouble swallowing.   Respiratory: Negative.    Cardiovascular: Negative.   Neurological: Negative.     Physical Exam Constitutional:      Appearance: Normal appearance.  HENT:     Head: Atraumatic.     Right Ear: Ear canal and external ear normal.     Left Ear: Ear canal and  external ear normal.     Ears:     Comments: Both TM appears dull.    Nose: Nose normal.     Right Turbinates: Enlarged and pale.     Left Turbinates: Enlarged and pale.     Mouth/Throat:     Mouth: Mucous membranes are moist.     Pharynx: Oropharynx is clear.  Eyes:     Extraocular Movements: Extraocular movements intact.  Cardiovascular:     Rate and Rhythm: Normal rate and regular rhythm.  Pulmonary:     Effort: Pulmonary effort is normal.     Breath sounds: Normal breath sounds.  Musculoskeletal:     Cervical back: Neck supple.  Skin:    General: Skin is warm.  Neurological:     Mental Status: He is alert.  Psychiatric:        Mood and Affect: Mood normal.        Behavior: Behavior normal.        Thought Content: Thought content normal.        Judgment: Judgment normal.       Assessment/Plan:  1. Inner ear dysfunction, left - methylPREDNISolone (MEDROL DOSEPAK) 4 MG TBPK tablet; Take as directed  Dispense: 1 each; Refill: 0  Take medicine as prescribed. Continue to watch for worsening symptoms. RTC in 7 days or sooner if any difficulty arises. Pt verbalized understanding and in agreement.   General Counseling: Rebecca Eaton understanding of the findings of todays visit and agrees with plan of treatment. I have discussed any further diagnostic evaluation that may be needed or ordered today. We also reviewed his medications today. he has been encouraged to call the office with any questions or concerns that should arise related to todays visit.    Time spent:20 Minutes    Gilberto Better, [place] Physician Assistant

## 2023-03-15 ENCOUNTER — Other Ambulatory Visit: Payer: Self-pay | Admitting: Family Medicine

## 2023-03-15 DIAGNOSIS — I10 Essential (primary) hypertension: Secondary | ICD-10-CM

## 2023-03-15 NOTE — Telephone Encounter (Signed)
Requested Prescriptions  Pending Prescriptions Disp Refills   atenolol (TENORMIN) 50 MG tablet [Pharmacy Med Name: ATENOLOL 50 MG TABLET] 90 tablet 1    Sig: TAKE 1 TABLET BY MOUTH EVERY DAY     Cardiovascular: Beta Blockers 2 Passed - 03/15/2023  2:16 AM      Passed - Cr in normal range and within 360 days    Creatinine, Ser  Date Value Ref Range Status  09/25/2022 1.23 0.61 - 1.24 mg/dL Final         Passed - Last BP in normal range    BP Readings from Last 1 Encounters:  03/13/23 114/72         Passed - Last Heart Rate in normal range    Pulse Readings from Last 1 Encounters:  03/13/23 67         Passed - Valid encounter within last 6 months    Recent Outpatient Visits           4 months ago Acute gout of right foot, unspecified cause   [place] [facility] Edwards, [place], PA-C   4 months ago Gout, unspecified cause, unspecified chronicity, unspecified site   [facility], [place], PA-C   6 months ago Primary hypertension   [place] [facility] Malva Limes, MD   7 months ago Annual physical exam   [facility] Alfredia Ferguson, PA-C   10 months ago Hamstring muscle strain, right, initial encounter   [facility] Ok Edwards, [place], [place]

## 2023-03-29 ENCOUNTER — Other Ambulatory Visit: Payer: Self-pay | Admitting: Nurse Practitioner

## 2023-03-29 DIAGNOSIS — J0110 Acute frontal sinusitis, unspecified: Secondary | ICD-10-CM

## 2023-04-02 DIAGNOSIS — Z131 Encounter for screening for diabetes mellitus: Secondary | ICD-10-CM | POA: Diagnosis not present

## 2023-04-02 DIAGNOSIS — J449 Chronic obstructive pulmonary disease, unspecified: Secondary | ICD-10-CM | POA: Diagnosis not present

## 2023-04-02 DIAGNOSIS — R972 Elevated prostate specific antigen [PSA]: Secondary | ICD-10-CM | POA: Diagnosis not present

## 2023-04-02 DIAGNOSIS — M79641 Pain in right hand: Secondary | ICD-10-CM | POA: Diagnosis not present

## 2023-04-02 DIAGNOSIS — M1A00X0 Idiopathic chronic gout, unspecified site, without tophus (tophi): Secondary | ICD-10-CM | POA: Diagnosis not present

## 2023-04-02 DIAGNOSIS — M79642 Pain in left hand: Secondary | ICD-10-CM | POA: Diagnosis not present

## 2023-04-02 DIAGNOSIS — I700 Atherosclerosis of aorta: Secondary | ICD-10-CM | POA: Diagnosis not present

## 2023-04-02 DIAGNOSIS — E781 Pure hyperglyceridemia: Secondary | ICD-10-CM | POA: Diagnosis not present

## 2023-04-02 DIAGNOSIS — I10 Essential (primary) hypertension: Secondary | ICD-10-CM | POA: Diagnosis not present

## 2023-04-03 ENCOUNTER — Ambulatory Visit: Payer: BC Managed Care – PPO | Admitting: Pulmonary Disease

## 2023-04-03 DIAGNOSIS — Z1389 Encounter for screening for other disorder: Secondary | ICD-10-CM | POA: Diagnosis not present

## 2023-04-06 ENCOUNTER — Other Ambulatory Visit: Payer: Self-pay

## 2023-04-06 MED ORDER — FENOFIBRATE 145 MG PO TABS: 145.0000 mg | ORAL_TABLET | Freq: Every day | ORAL | 3 refills | Status: AC

## 2023-04-09 ENCOUNTER — Other Ambulatory Visit: Payer: Self-pay

## 2023-04-09 ENCOUNTER — Ambulatory Visit (INDEPENDENT_AMBULATORY_CARE_PROVIDER_SITE_OTHER): Payer: Self-pay | Admitting: Physician Assistant

## 2023-04-09 VITALS — BP 126/84 | HR 64 | Temp 97.5°F

## 2023-04-09 DIAGNOSIS — S61214A Laceration without foreign body of right ring finger without damage to nail, initial encounter: Secondary | ICD-10-CM

## 2023-04-09 NOTE — Progress Notes (Signed)
[facility] [address] [place], [place] [address]   Office Visit Note  Patient Name: Derek [name] Date of Birth [id number]  Medical Record number [id number]  Date of Service: 04/09/2023  Chief Complaint  Patient presents with   Laceration    Right Ring Finger: Cut with pocket knife 04/08/2023     57 y/o M presents to the clinic for an open wound on right ring finger tip since last night. Up to date with tetanus booster. He is having a difficult time controlling bleeding.   Laceration       Current Medication:  Outpatient Encounter Medications as of 04/09/2023  Medication Sig   amLODipine (NORVASC) 5 MG tablet Take 1 tablet (5 mg total) by mouth daily.   valsartan (DIOVAN) 160 MG tablet TAKE 1 TABLET BY MOUTH EVERY DAY   allopurinol (ZYLOPRIM) 300 MG tablet Take 1 tablet (300 mg total) by mouth daily.   atenolol (TENORMIN) 50 MG tablet TAKE 1 TABLET BY MOUTH EVERY DAY   Budeson-Glycopyrrol-Formoterol (BREZTRI AEROSPHERE) 160-9-4.8 MCG/ACT AERO Inhale 2 puffs into the lungs in the morning and at bedtime.   citalopram (CELEXA) 40 MG tablet TAKE 1 TABLET BY MOUTH EVERY DAY   colchicine 0.6 MG tablet Take one tablet daily (Patient not taking: Reported on 03/13/2023)   fenofibrate (TRICOR) 145 MG tablet Take 1 tablet (145 mg total) by mouth daily.   fluticasone (FLONASE) 50 MCG/ACT nasal spray Place 2 sprays into both nostrils daily.   indomethacin (INDOCIN) 25 MG capsule Take 1 capsule (25 mg total) by mouth 3 (three) times daily with meals. (Patient not taking: Reported on 11/20/2022)   indomethacin (INDOCIN) 50 MG capsule Take 1 capsule (50 mg total) by mouth 3 (three) times daily with meals. For first 4 days then following days take 25mg  capsules as prescribed. (Patient not taking: Reported on 11/20/2022)   levalbuterol (XOPENEX HFA) 45 MCG/ACT inhaler Inhale 2 puffs into the lungs every 6 (six) hours as needed for wheezing or shortness of breath.   methylPREDNISolone  (MEDROL DOSEPAK) 4 MG TBPK tablet Take as directed   naltrexone (DEPADE) 50 MG tablet Take 1 tablet (50 mg total) by mouth daily. (Patient not taking: Reported on 03/13/2023)   omeprazole (PRILOSEC OTC) 20 MG tablet Take 1 tablet (20 mg total) by mouth daily for 14 days.   sildenafil (VIAGRA) 100 MG tablet Take 1 tablet (100 mg total) by mouth daily as needed for erectile dysfunction.   varenicline (CHANTIX CONTINUING MONTH PAK) 1 MG tablet Take as directed in the package.   Facility-Administered Encounter Medications as of 04/09/2023  Medication   albuterol (PROVENTIL) (2.5 MG/3ML) 0.083% nebulizer solution 2.5 mg      Medical History: Past Medical History:  Diagnosis Date   Gout    Hypertension      Vital Signs: BP 126/84 (BP Location: Left Arm, Patient Position: Sitting)   Pulse 64   Temp (!) 97.5 F (36.4 C) (Tympanic)   SpO2 98%    Review of Systems  Musculoskeletal: Negative.   Skin:  Positive for wound.  Neurological: Negative.     Physical Exam Musculoskeletal:     Comments: Right Ring Finger: Sensation is grossly normal. Nail intact without laceration. PIP and DIP joint non-tender and full ROM.   Skin:    Capillary Refill: Capillary refill takes less than 2 seconds.     Comments: Right Ring Finger: Linear laceration less than 1.0 cm long at the distal tip.  Psychiatric:        Mood and Affect: Mood normal.        Behavior: Behavior normal.       Assessment/Plan:  1. Laceration of right ring finger without foreign body without damage to nail, initial encounter  Cleaned and soaked finger in a mixture of hydrogen peroxide and saline solution.  Applied Bacitracin ointment with telfa dressing. Keep area clean, dry, and covered. Watch for sings of infection ie redness, swelling, purulent drainage, etc.  RTC tomorrow for dressing change. Pt verbalized understanding and in agreement.   General Counseling: Rebecca Eaton understanding of the findings of  todays visit and agrees with plan of treatment. I have discussed any further diagnostic evaluation that may be needed or ordered today. We also reviewed his medications today. he has been encouraged to call the office with any questions or concerns that should arise related to todays visit.    Time spent:20 Minutes    Gilberto Better, [place] Physician Assistant

## 2023-04-10 ENCOUNTER — Other Ambulatory Visit: Payer: Self-pay | Admitting: Family Medicine

## 2023-04-10 ENCOUNTER — Ambulatory Visit (INDEPENDENT_AMBULATORY_CARE_PROVIDER_SITE_OTHER): Payer: Self-pay | Admitting: Physician Assistant

## 2023-04-10 VITALS — BP 120/76 | HR 61 | Temp 97.3°F

## 2023-04-10 DIAGNOSIS — F411 Generalized anxiety disorder: Secondary | ICD-10-CM

## 2023-04-10 DIAGNOSIS — S61214A Laceration without foreign body of right ring finger without damage to nail, initial encounter: Secondary | ICD-10-CM

## 2023-04-10 NOTE — Progress Notes (Signed)
[facility] [address] [place], [place] [address]   Office Visit Note  Patient Name: Derek [name] Date of Birth [id number]  Medical Record number [id number]  Date of Service: 04/10/2023  Chief Complaint  Patient presents with   Laceration    R ring finger   Follow-up     57 y/o M presents to the clinic for a f/u for wound check on his right ring finger.Doing well.    Laceration       Current Medication:  Outpatient Encounter Medications as of 04/10/2023  Medication Sig   allopurinol (ZYLOPRIM) 300 MG tablet Take 1 tablet (300 mg total) by mouth daily.   amLODipine (NORVASC) 5 MG tablet Take 1 tablet (5 mg total) by mouth daily.   atenolol (TENORMIN) 50 MG tablet TAKE 1 TABLET BY MOUTH EVERY DAY   Budeson-Glycopyrrol-Formoterol (BREZTRI AEROSPHERE) 160-9-4.8 MCG/ACT AERO Inhale 2 puffs into the lungs in the morning and at bedtime.   colchicine 0.6 MG tablet Take one tablet daily (Patient not taking: Reported on 03/13/2023)   fenofibrate (TRICOR) 145 MG tablet Take 1 tablet (145 mg total) by mouth daily.   fluticasone (FLONASE) 50 MCG/ACT nasal spray Place 2 sprays into both nostrils daily.   indomethacin (INDOCIN) 25 MG capsule Take 1 capsule (25 mg total) by mouth 3 (three) times daily with meals. (Patient not taking: Reported on 11/20/2022)   indomethacin (INDOCIN) 50 MG capsule Take 1 capsule (50 mg total) by mouth 3 (three) times daily with meals. For first 4 days then following days take 25mg  capsules as prescribed. (Patient not taking: Reported on 11/20/2022)   levalbuterol (XOPENEX HFA) 45 MCG/ACT inhaler Inhale 2 puffs into the lungs every 6 (six) hours as needed for wheezing or shortness of breath.   methylPREDNISolone (MEDROL DOSEPAK) 4 MG TBPK tablet Take as directed   naltrexone (DEPADE) 50 MG tablet Take 1 tablet (50 mg total) by mouth daily. (Patient not taking: Reported on 03/13/2023)   omeprazole (PRILOSEC OTC) 20 MG tablet Take 1 tablet (20 mg total) by  mouth daily for 14 days.   sildenafil (VIAGRA) 100 MG tablet Take 1 tablet (100 mg total) by mouth daily as needed for erectile dysfunction.   valsartan (DIOVAN) 160 MG tablet TAKE 1 TABLET BY MOUTH EVERY DAY   varenicline (CHANTIX CONTINUING MONTH PAK) 1 MG tablet Take as directed in the package.   [DISCONTINUED] citalopram (CELEXA) 40 MG tablet TAKE 1 TABLET BY MOUTH EVERY DAY   Facility-Administered Encounter Medications as of 04/10/2023  Medication   albuterol (PROVENTIL) (2.5 MG/3ML) 0.083% nebulizer solution 2.5 mg      Medical History: Past Medical History:  Diagnosis Date   Gout    Hypertension      Vital Signs: BP 120/76 (BP Location: Left Arm, Patient Position: Sitting)   Pulse 61   Temp (!) 97.3 F (36.3 C) (Tympanic)   SpO2 97%    Review of Systems  Constitutional: Negative.   Skin:  Positive for wound.    Physical Exam Constitutional:      Appearance: Normal appearance.  Skin:    Capillary Refill: Capillary refill takes less than 2 seconds.     Findings: Laceration present.     Comments: Right ring finger: healing wound without redness, swelling, or purulent drainage. Nail intact. Not actively bleeding.   Neurological:     General: No focal deficit present.     Mental Status: He is alert and oriented to person, place, and time.  Psychiatric:        Mood and Affect: Mood normal.        Behavior: Behavior normal.       Assessment/Plan:  1. Laceration of right ring finger without foreign body without damage to nail, initial encounter  Applied a new band aid on the wound.  Keep wound clean and dry. Let air dry finger at home. Keep covered at workplace. Continue to watch for sings of infection. RTC prn for wound check. Pt verbalized understanding and in agreement.    General Counseling: Derek [name] understanding of the findings of todays visit and agrees with plan of treatment. I have discussed any further diagnostic evaluation that may be  needed or ordered today. We also reviewed his medications today. he has been encouraged to call the office with any questions or concerns that should arise related to todays visit.    Time spent:20 Minutes    Derek [name], [place] Physician Assistant

## 2023-05-03 ENCOUNTER — Other Ambulatory Visit: Payer: Self-pay | Admitting: Family Medicine

## 2023-05-03 DIAGNOSIS — I10 Essential (primary) hypertension: Secondary | ICD-10-CM

## 2023-05-03 NOTE — Telephone Encounter (Signed)
Requested medication (s) are due for refill today: yes  Requested medication (s) are on the active medication list: yes  Last refill:  09/25/22  Future visit scheduled: 09/25/22  Notes to clinic:  Unable to refill per protocol, last refill by another provider.      Requested Prescriptions  Pending Prescriptions Disp Refills   amLODipine (NORVASC) 5 MG tablet [Pharmacy Med Name: AMLODIPINE BESYLATE 5 MG TAB] 90 tablet 1    Sig: Take 1 tablet (5 mg total) by mouth daily.     There is no refill protocol information for this order

## 2023-05-13 ENCOUNTER — Other Ambulatory Visit: Payer: Self-pay | Admitting: Pulmonary Disease

## 2023-06-11 ENCOUNTER — Other Ambulatory Visit: Payer: Self-pay

## 2023-06-11 MED ORDER — LEVALBUTEROL TARTRATE 45 MCG/ACT IN AERO: 2.0000 | INHALATION_SPRAY | Freq: Four times a day (QID) | RESPIRATORY_TRACT | 6 refills | Status: AC | PRN

## 2023-06-12 ENCOUNTER — Other Ambulatory Visit (HOSPITAL_COMMUNITY): Payer: Self-pay

## 2023-06-12 ENCOUNTER — Telehealth: Payer: Self-pay

## 2023-06-12 NOTE — Telephone Encounter (Signed)
Pharmacy Patient Advocate Encounter   Received notification from Pt Calls Messages that prior authorization for Levalbuterol Tartrate 45MCG/ACT aerosol is required/requested.   Insurance verification completed.   The patient is insured through [employer] .   Per test claim: PA required; PA submitted to [facility] via CoverMyMeds Key/confirmation #/EOC [id number] Status is pending

## 2023-06-12 NOTE — Telephone Encounter (Signed)
Pharmacy Patient Advocate Encounter  Received notification from [facility] that Prior Authorization for Levalbuterol Tartrate 45MCG/ACT aerosol has been APPROVED from 06-12-2023 to 06-11-2024. Ran test claim, Copay is $59.53. This test claim was processed through [employer]- copay amounts may vary at other pharmacies due to pharmacy/plan contracts, or as the patient moves through the different stages of their insurance plan.   PA #/Case ID/Reference #: [id number]

## 2023-06-12 NOTE — Telephone Encounter (Signed)
Spoke to patient. He is requesting a refill on xopenex HFA. [facility]. [facility] stated that [facility] not [facility].   Spoke to [place] with [facility]. He stated that PA is needed. PA team, can you guys start PA. Thanks

## 2023-06-13 NOTE — Telephone Encounter (Signed)
Please see 06/12/23 phone note. Xopenex has been approved.  Ryan with [facility] is aware of approval. Rx will be ready for pickup today.  Patient is aware and voiced his understanding.  Nothing further needed.

## 2023-07-09 ENCOUNTER — Other Ambulatory Visit: Payer: Self-pay | Admitting: Family Medicine

## 2023-07-09 DIAGNOSIS — F411 Generalized anxiety disorder: Secondary | ICD-10-CM

## 2023-09-12 ENCOUNTER — Other Ambulatory Visit: Payer: Self-pay | Admitting: Family Medicine

## 2023-09-12 DIAGNOSIS — I10 Essential (primary) hypertension: Secondary | ICD-10-CM

## 2023-09-13 NOTE — Telephone Encounter (Signed)
Requested medication (s) are due for refill today: yes  Requested medication (s) are on the active medication list: yes  Last refill:  02/27/23 #90 1 RF  Future visit scheduled: no  Notes to clinic:  pt will call back. Per his daughter pt just had all of his top teeth removed.    Requested Prescriptions  Pending Prescriptions Disp Refills   valsartan (DIOVAN) 160 MG tablet [Pharmacy Med Name: VALSARTAN 160 MG TABLET] 90 tablet 1    Sig: TAKE 1 TABLET BY MOUTH EVERY DAY     Cardiovascular:  Angiotensin Receptor Blockers Failed - 09/12/2023  1:51 AM      Failed - Cr in normal range and within 180 days    Creatinine, Ser  Date Value Ref Range Status  09/25/2022 1.23 0.61 - 1.24 mg/dL Final         Failed - K in normal range and within 180 days    Potassium  Date Value Ref Range Status  09/25/2022 4.1 3.5 - 5.1 mmol/L Final         Failed - Valid encounter within last 6 months    Recent Outpatient Visits           10 months ago Acute gout of right foot, unspecified cause   [facility] Alfredia Ferguson, [facility]-C   10 months ago Gout, unspecified cause, unspecified chronicity, unspecified site   [facility] Alfredia Ferguson, PA-C   1 year ago Primary hypertension   [place] [facility] Malva Limes, MD   1 year ago Annual physical exam   [place] [facility] Alfredia Ferguson, PA-C   1 year ago Hamstring muscle strain, right, initial encounter   [facility] [place], [place]              Passed - Patient is not pregnant      Passed - Last BP in normal range    BP Readings from Last 1 Encounters:  04/10/23 120/76

## 2023-09-25 DIAGNOSIS — M79641 Pain in right hand: Secondary | ICD-10-CM | POA: Diagnosis not present

## 2023-09-25 DIAGNOSIS — R7309 Other abnormal glucose: Secondary | ICD-10-CM | POA: Diagnosis not present

## 2023-09-25 DIAGNOSIS — F411 Generalized anxiety disorder: Secondary | ICD-10-CM | POA: Diagnosis not present

## 2023-09-25 DIAGNOSIS — M1A00X0 Idiopathic chronic gout, unspecified site, without tophus (tophi): Secondary | ICD-10-CM | POA: Diagnosis not present

## 2023-10-03 DIAGNOSIS — G5603 Carpal tunnel syndrome, bilateral upper limbs: Secondary | ICD-10-CM | POA: Diagnosis not present

## 2023-10-06 ENCOUNTER — Other Ambulatory Visit: Payer: Self-pay | Admitting: Family Medicine

## 2023-10-06 DIAGNOSIS — I10 Essential (primary) hypertension: Secondary | ICD-10-CM

## 2023-10-08 NOTE — Telephone Encounter (Signed)
Requested Prescriptions  Refused Prescriptions Disp Refills   valsartan (DIOVAN) 160 MG tablet [Pharmacy Med Name: VALSARTAN 160 MG TABLET] 90 tablet 1    Sig: TAKE 1 TABLET BY MOUTH EVERY DAY     Cardiovascular:  Angiotensin Receptor Blockers Failed - 10/08/2023  1:17 PM      Failed - Cr in normal range and within 180 days    Creatinine, Ser  Date Value Ref Range Status  09/25/2022 1.23 0.61 - 1.24 mg/dL Final         Failed - K in normal range and within 180 days    Potassium  Date Value Ref Range Status  09/25/2022 4.1 3.5 - 5.1 mmol/L Final         Failed - Valid encounter within last 6 months    Recent Outpatient Visits           11 months ago Acute gout of right foot, unspecified cause   [place] [facility] Alfredia Ferguson, PA-C   11 months ago Gout, unspecified cause, unspecified chronicity, unspecified site   [facility] Alfredia Ferguson, PA-C   1 year ago Primary hypertension   [place] [facility] Malva Limes, MD   1 year ago Annual physical exam   [facility] Alfredia Ferguson, PA-C   1 year ago Hamstring muscle strain, right, initial encounter   [facility] [place], [place]              Passed - Patient is not pregnant      Passed - Last BP in normal range    BP Readings from Last 1 Encounters:  04/10/23 120/76

## 2023-10-10 ENCOUNTER — Other Ambulatory Visit: Payer: Self-pay | Admitting: Family Medicine

## 2023-10-10 DIAGNOSIS — I10 Essential (primary) hypertension: Secondary | ICD-10-CM

## 2023-10-10 NOTE — Telephone Encounter (Signed)
Called pt to make appointment. Pt stated he no longer is a pt at [facility]. He now goes to [facility]. Pt call [facility] to [facility] to advise of new PCP/prescriber.

## 2023-10-19 DIAGNOSIS — G5603 Carpal tunnel syndrome, bilateral upper limbs: Secondary | ICD-10-CM | POA: Diagnosis not present

## 2023-10-22 ENCOUNTER — Other Ambulatory Visit: Payer: Self-pay | Admitting: *Deleted

## 2023-10-22 DIAGNOSIS — I10 Essential (primary) hypertension: Secondary | ICD-10-CM

## 2023-10-22 NOTE — Telephone Encounter (Signed)
Appointment scheduled for 12/07/23

## 2023-10-22 NOTE — Telephone Encounter (Signed)
Refill Request.  

## 2023-10-23 MED ORDER — VALSARTAN 160 MG PO TABS: 160.0000 mg | ORAL_TABLET | Freq: Every day | ORAL | 0 refills | Status: AC

## 2023-10-26 DIAGNOSIS — I10 Essential (primary) hypertension: Secondary | ICD-10-CM | POA: Diagnosis not present

## 2023-10-26 DIAGNOSIS — J449 Chronic obstructive pulmonary disease, unspecified: Secondary | ICD-10-CM | POA: Diagnosis not present

## 2023-10-26 DIAGNOSIS — F411 Generalized anxiety disorder: Secondary | ICD-10-CM | POA: Diagnosis not present

## 2023-10-26 DIAGNOSIS — E781 Pure hyperglyceridemia: Secondary | ICD-10-CM | POA: Diagnosis not present

## 2023-10-28 ENCOUNTER — Other Ambulatory Visit: Payer: Self-pay | Admitting: Family Medicine

## 2023-10-28 DIAGNOSIS — I10 Essential (primary) hypertension: Secondary | ICD-10-CM

## 2023-10-30 NOTE — Telephone Encounter (Signed)
 Pt has new PCP Selinda Quan, [facility]  Requested Prescriptions  Pending Prescriptions Disp Refills   amLODipine  (NORVASC ) 5 MG tablet [Pharmacy Med Name: AMLODIPINE  BESYLATE 5 MG TAB] 90 tablet 1    Sig: TAKE 1 TABLET (5 MG TOTAL) BY MOUTH DAILY.     Cardiovascular: Calcium Channel Blockers 2 Failed - 10/30/2023  3:24 PM      Failed - Valid encounter within last 6 months    Recent Outpatient Visits           11 months ago Acute gout of right foot, unspecified cause   [facility] Cyndi Shaver, PA-C   11 months ago Gout, unspecified cause, unspecified chronicity, unspecified site   [facility] Cyndi Shaver, PA-C   1 year ago Primary hypertension   [place] Gasper Nancyann BRAVO, MD   1 year ago Annual physical exam   [place] Cyndi Shaver, PA-C   1 year ago Hamstring muscle strain, right, initial encounter   [facility] Cyndi Shaver, PA-C       Future Appointments             In 1 month Agbor-Etang, Redell, MD [facility] - Last BP in normal range    BP Readings from Last 1 Encounters:  04/10/23 120/76         Passed - Last Heart Rate in normal range    Pulse Readings from Last 1 Encounters:  04/10/23 61

## 2023-12-06 ENCOUNTER — Other Ambulatory Visit: Payer: Self-pay | Admitting: Family Medicine

## 2023-12-06 DIAGNOSIS — I10 Essential (primary) hypertension: Secondary | ICD-10-CM

## 2023-12-07 ENCOUNTER — Encounter: Payer: Self-pay | Admitting: Cardiology

## 2023-12-07 ENCOUNTER — Ambulatory Visit: Payer: BC Managed Care – PPO | Attending: Cardiology | Admitting: Cardiology

## 2023-12-07 VITALS — BP 156/100 | HR 58 | Ht 72.0 in | Wt 198.0 lb

## 2023-12-07 DIAGNOSIS — F17200 Nicotine dependence, unspecified, uncomplicated: Secondary | ICD-10-CM

## 2023-12-07 DIAGNOSIS — E781 Pure hyperglyceridemia: Secondary | ICD-10-CM | POA: Diagnosis not present

## 2023-12-07 DIAGNOSIS — I10 Essential (primary) hypertension: Secondary | ICD-10-CM

## 2023-12-07 DIAGNOSIS — F17210 Nicotine dependence, cigarettes, uncomplicated: Secondary | ICD-10-CM | POA: Diagnosis not present

## 2023-12-07 MED ORDER — NICOTINE 14 MG/24HR TD PT24: 14.0000 mg | MEDICATED_PATCH | Freq: Every day | TRANSDERMAL | 0 refills | Status: DC

## 2023-12-07 MED ORDER — AMLODIPINE BESYLATE 5 MG PO TABS: 5.0000 mg | ORAL_TABLET | Freq: Every day | ORAL | 1 refills | Status: DC

## 2023-12-07 MED ORDER — NICOTINE 21 MG/24HR TD PT24: 21.0000 mg | MEDICATED_PATCH | Freq: Every day | TRANSDERMAL | 0 refills | Status: AC

## 2023-12-07 MED ORDER — NICOTINE 7 MG/24HR TD PT24: 7.0000 mg | MEDICATED_PATCH | Freq: Every day | TRANSDERMAL | 0 refills | Status: DC

## 2023-12-07 NOTE — Patient Instructions (Addendum)
Medication Instructions:  Your physician recommends the following medication changes.  START TAKING: Nicoderm (daily skin patch) 21 mg patch for 6 weeks, then 14 mg patch for 2 weeks, finally 7 mg patch for 2 weeks  *If you need a refill on your cardiac medications before your next appointment, please call your pharmacy*   Lab Work: None ordered at this time    Follow-Up: At [facility], you and your health needs are our priority.  As part of our continuing mission to provide you with exceptional heart care, we have created designated Provider Care Teams.  These Care Teams include your primary Cardiologist (physician) and Advanced Practice Providers (APPs -  Physician Assistants and Nurse Practitioners) who all work together to provide you with the care you need, when you need it.  We recommend signing up for the patient portal called "MyChart".  Sign up information is provided on this After Visit Summary.  MyChart is used to connect with patients for Virtual Visits (Telemedicine).  Patients are able to view lab/test results, encounter notes, upcoming appointments, etc.  Non-urgent messages can be sent to your provider as well.   To learn more about what you can do with MyChart, go to [email or web address].    Your next appointment:   Follow up as needed  Provider:   You may see Debbe Odea, MD or one of the following Advanced Practice Providers on your designated Care Team:   Nicolasa Ducking, NP Eula Listen, PA-C Cadence Fransico Michael, PA-C Charlsie Quest, NP Carlos Levering, NP

## 2023-12-07 NOTE — Progress Notes (Signed)
Cardiology Office Note:    Date:  12/07/2023   ID:  Derek [name], DOB 1966-10-18, MRN [id number]  PCP:  Malva Limes, MD   [facility] Providers Cardiologist:  Debbe Odea, MD     Referring MD: Malva Limes, MD   Chief Complaint  Patient presents with   Follow-up    Patient denies new or acute cardiac problems/concerns today.  Patient out of Amlodipine 5 mg every day for 1 week.  BP in office today is 150/101 and 156/100 on second check.    History of Present Illness:    Derek [name] is a 58 y.o. male with a hx of hypertension, elevated triglycerides, current smoker 30+ years, who presents for follow-up.   Denies chest pain, has not taking amlodipine over the past week due to running out.  Compliant with valsartan, atenolol as prescribed.  Also takes fenofibrate.  He still smokes, is trying to quit.  Prior notes/testing Echo 10/2022 EF 60 to 65% Coronary CT 09/2022 calcium score 0, no CAD  Past Medical History:  Diagnosis Date   Gout    Hypertension     Past Surgical History:  Procedure Laterality Date   CERVICAL DISC SURGERY     COLONOSCOPY WITH PROPOFOL N/A 02/18/2018   Procedure: COLONOSCOPY WITH PROPOFOL;  Surgeon: Toney Reil, MD;  Location: [facility] ENDOSCOPY;  Service: Gastroenterology;  Laterality: N/A;   TONSILLECTOMY AND ADENOIDECTOMY      Current Medications: Current Meds  Medication Sig   allopurinol (ZYLOPRIM) 300 MG tablet Take 1 tablet (300 mg total) by mouth daily.   atenolol (TENORMIN) 50 MG tablet TAKE 1 TABLET BY MOUTH EVERY DAY   Budeson-Glycopyrrol-Formoterol (BREZTRI AEROSPHERE) 160-9-4.8 MCG/ACT AERO Inhale 2 puffs into the lungs in the morning and at bedtime.   citalopram (CELEXA) 40 MG tablet TAKE 1 TABLET BY MOUTH EVERY DAY   fenofibrate (TRICOR) 145 MG tablet Take 1 tablet (145 mg total) by mouth daily.   fluticasone (FLONASE) 50 MCG/ACT nasal spray Place 2 sprays into both nostrils daily.   levalbuterol (XOPENEX  HFA) 45 MCG/ACT inhaler Inhale 2 puffs into the lungs every 6 (six) hours as needed for wheezing or shortness of breath.   nicotine (NICODERM CQ) 14 mg/24hr patch Place 1 patch (14 mg total) onto the skin daily. For 2 weeks   nicotine (NICODERM CQ) 21 mg/24hr patch Place 1 patch (21 mg total) onto the skin daily. For 6 weeks   nicotine (NICODERM CQ) 7 mg/24hr patch Place 1 patch (7 mg total) onto the skin daily. For 2 weeks   sildenafil (VIAGRA) 100 MG tablet Take 1 tablet (100 mg total) by mouth daily as needed for erectile dysfunction.   valsartan (DIOVAN) 160 MG tablet Take 1 tablet (160 mg total) by mouth daily.   varenicline (CHANTIX) 1 MG tablet TAKE AS DIRECTED IN THE PACKAGE.   [DISCONTINUED] amLODipine (NORVASC) 5 MG tablet TAKE 1 TABLET (5 MG TOTAL) BY MOUTH DAILY.     Allergies:   Patient has no known allergies.   Social History   Socioeconomic History   Marital status: Divorced    Spouse name: Not on file   Number of children: Not on file   Years of education: Not on file   Highest education level: Not on file  Occupational History   Not on file  Tobacco Use   Smoking status: Every Day    Current packs/day: 0.50    Average packs/day: 0.5 packs/day for 36.0 years (  18.0 ttl pk-yrs)    Types: Cigarettes   Smokeless tobacco: Current    Types: Snuff   Tobacco comments:    8 cigarettes daily 01/30/2023 khj  Substance and Sexual Activity   Alcohol use: Yes    Comment: occasionally   Drug use: No   Sexual activity: Not on file  Other Topics Concern   Not on file  Social History Narrative   Not on file   Social Drivers of Health   Financial Resource Strain: Low Risk  (09/25/2023)   Received from [facility]   Overall Financial Resource Strain (CARDIA)    Difficulty of Paying Living Expenses: Not hard at all  Food Insecurity: No Food Insecurity (04/02/2023)   Received from [facility], [facility]   Hunger Vital Sign     Worried About Running Out of Food in the Last Year: Never true    Ran Out of Food in the Last Year: Never true  Transportation Needs: No Transportation Needs (04/02/2023)   Received from [facility], [facility]   [facility] - Transportation    In the past 12 months, has lack of transportation kept you from medical appointments or from getting medications?: No    Lack of Transportation (Non-Medical): No  Physical Activity: Not on file  Stress: Not on file  Social Connections: Not on file     Family History: The patient's family history includes Fibromyalgia in his mother; Healthy in his sister; Hypertension in his father; Prostate cancer in his father.  ROS:   Please see the history of present illness.     All other systems reviewed and are negative.  EKGs/Labs/Other Studies Reviewed:    The following studies were reviewed today:   Recent Labs: No results found for requested labs within last 365 days.  Recent Lipid Panel    Component Value Date/Time   CHOL 161 08/07/2022 0839   TRIG 697 (HH) 08/07/2022 0839   HDL 23 (L) 08/07/2022 0839   CHOLHDL 7.0 (H) 08/07/2022 0839   LDLCALC 41 08/07/2022 0839   Outside lipid panel 04/02/2023 total cholesterol 147, triglycerides 107, LDL 88, HDL 38.1.  Risk Assessment/Calculations:            Physical Exam:    VS:  BP (!) 156/100 (BP Location: Left Arm, Patient Position: Sitting, Cuff Size: Large)   Pulse (!) 58   Ht 6' (1.829 m)   Wt 198 lb (89.8 kg)   SpO2 98%   BMI 26.85 kg/m     Wt Readings from Last 3 Encounters:  12/07/23 198 lb (89.8 kg)  03/13/23 192 lb 6.4 oz (87.3 kg)  01/30/23 199 lb 12.8 oz (90.6 kg)     GEN:  Well nourished, well developed in no acute distress HEENT: Normal NECK: No JVD; No carotid bruits CARDIAC: RRR, no murmurs, rubs, gallops RESPIRATORY:  Clear to auscultation without rales, wheezing or rhonchi  ABDOMEN: Soft, non-tender,  non-distended MUSCULOSKELETAL:  No edema; No deformity  SKIN: Warm and dry NEUROLOGIC:  Alert and oriented x 3 PSYCHIATRIC:  Normal affect   ASSESSMENT:    1. Primary hypertension   2. Pure hypertriglyceridemia   3. Smoking     PLAN:    In order of problems listed above:  Hypertension, BP elevated likely from running out of Norvasc.  Prescribed Norvasc 5 mg daily.  Continue valsartan 160 mg daily, atenolol 50 mg daily.  Elevated triglycerides, cholesterol controlled.  Continue  fenofibrate. Current smoker, smoking cessation advised.  Prescribe nicotine patch.  Follow-up with PCP.  Follow-up as needed.     Medication Adjustments/Labs and Tests Ordered: Current medicines are reviewed at length with the patient today.  Concerns regarding medicines are outlined above.  No orders of the defined types were placed in this encounter.  Meds ordered this encounter  Medications   nicotine (NICODERM CQ) 21 mg/24hr patch    Sig: Place 1 patch (21 mg total) onto the skin daily. For 6 weeks    Dispense:  42 patch    Refill:  0   nicotine (NICODERM CQ) 14 mg/24hr patch    Sig: Place 1 patch (14 mg total) onto the skin daily. For 2 weeks    Dispense:  14 patch    Refill:  0   nicotine (NICODERM CQ) 7 mg/24hr patch    Sig: Place 1 patch (7 mg total) onto the skin daily. For 2 weeks    Dispense:  14 patch    Refill:  0   amLODipine (NORVASC) 5 MG tablet    Sig: Take 1 tablet (5 mg total) by mouth daily.    Dispense:  90 tablet    Refill:  1    Patient Instructions  Medication Instructions:  Your physician recommends the following medication changes.  START TAKING: Nicoderm (daily skin patch) 21 mg patch for 6 weeks, then 14 mg patch for 2 weeks, finally 7 mg patch for 2 weeks  *If you need a refill on your cardiac medications before your next appointment, please call your pharmacy*   Lab Work: None ordered at this time    Follow-Up: At [facility], you and your  health needs are our priority.  As part of our continuing mission to provide you with exceptional heart care, we have created designated Provider Care Teams.  These Care Teams include your primary Cardiologist (physician) and Advanced Practice Providers (APPs -  Physician Assistants and Nurse Practitioners) who all work together to provide you with the care you need, when you need it.  We recommend signing up for the patient portal called "MyChart".  Sign up information is provided on this After Visit Summary.  MyChart is used to connect with patients for Virtual Visits (Telemedicine).  Patients are able to view lab/test results, encounter notes, upcoming appointments, etc.  Non-urgent messages can be sent to your provider as well.   To learn more about what you can do with MyChart, go to [email or web address].    Your next appointment:   Follow up as needed  Provider:   You may see Debbe Odea, MD or one of the following Advanced Practice Providers on your designated Care Team:   Nicolasa Ducking, NP Eula Listen, PA-C Cadence Fransico Michael, PA-C Charlsie Quest, NP Carlos Levering, NP        Signed, Debbe Odea, MD  12/07/2023 1:18 PM    [facility]

## 2024-01-03 DIAGNOSIS — G5603 Carpal tunnel syndrome, bilateral upper limbs: Secondary | ICD-10-CM | POA: Diagnosis not present

## 2024-01-24 ENCOUNTER — Other Ambulatory Visit: Payer: Self-pay | Admitting: Pulmonary Disease

## 2024-01-26 ENCOUNTER — Other Ambulatory Visit: Payer: Self-pay | Admitting: Pulmonary Disease

## 2024-02-06 ENCOUNTER — Other Ambulatory Visit: Payer: Self-pay

## 2024-02-06 MED ORDER — BREZTRI AEROSPHERE 160-9-4.8 MCG/ACT IN AERO: 2.0000 | INHALATION_SPRAY | Freq: Two times a day (BID) | RESPIRATORY_TRACT | 1 refills | Status: DC

## 2024-02-25 DIAGNOSIS — G5601 Carpal tunnel syndrome, right upper limb: Secondary | ICD-10-CM | POA: Diagnosis not present

## 2024-03-07 ENCOUNTER — Ambulatory Visit (INDEPENDENT_AMBULATORY_CARE_PROVIDER_SITE_OTHER): Payer: Self-pay | Admitting: Adult Health

## 2024-03-07 ENCOUNTER — Other Ambulatory Visit: Payer: Self-pay

## 2024-03-07 ENCOUNTER — Encounter: Payer: Self-pay | Admitting: Adult Health

## 2024-03-07 VITALS — BP 123/105 | HR 66 | Temp 98.2°F | Ht 72.0 in | Wt 199.0 lb

## 2024-03-07 DIAGNOSIS — Z5189 Encounter for other specified aftercare: Secondary | ICD-10-CM

## 2024-03-07 NOTE — Progress Notes (Signed)
 [facility] [address] [place], [place] [address]   Office Visit Note  Patient Name: Derek [name] Date of Birth [id number]  Medical Record number [id number]  Date of Service: 03/07/2024  Chief Complaint  Patient presents with   Acute Visit    Patient recently had R carpal tunnel surgey and states his incision is painful. He has an appt with surgeon to have his stitches removed in 2 days but would like to make sure the incision is not infected.      HPI Pt is here for acute visit. He had carpal tunnel surgery 2 weeks ago.  He is due to get his stitches out in 3 days.  He wants someone to look at it and make sure its ok, because its hurting.   Right hand, palm   Current Medication:  Outpatient Encounter Medications as of 03/07/2024  Medication Sig   allopurinol  (ZYLOPRIM ) 300 MG tablet Take 1 tablet (300 mg total) by mouth daily.   amLODipine  (NORVASC ) 5 MG tablet Take 1 tablet (5 mg total) by mouth daily.   atenolol  (TENORMIN ) 50 MG tablet TAKE 1 TABLET BY MOUTH EVERY DAY   fenofibrate  (TRICOR ) 145 MG tablet Take 1 tablet (145 mg total) by mouth daily.   levalbuterol  (XOPENEX  HFA) 45 MCG/ACT inhaler Inhale 2 puffs into the lungs every 6 (six) hours as needed for wheezing or shortness of breath.   sildenafil  (VIAGRA ) 100 MG tablet Take 1 tablet (100 mg total) by mouth daily as needed for erectile dysfunction.   valsartan  (DIOVAN ) 160 MG tablet Take 1 tablet (160 mg total) by mouth daily.   budeson-glycopyrrolate-formoterol (BREZTRI  AEROSPHERE) 160-9-4.8 MCG/ACT AERO inhaler Inhale 2 puffs into the lungs in the morning and at bedtime. (Patient not taking: Reported on 03/07/2024)   citalopram  (CELEXA ) 40 MG tablet TAKE 1 TABLET BY MOUTH EVERY DAY (Patient not taking: Reported on 03/07/2024)   colchicine  0.6 MG tablet Take one tablet daily (Patient not taking: Reported on 03/13/2023)   [DISCONTINUED] fluticasone  (FLONASE ) 50 MCG/ACT nasal spray Place 2 sprays into both nostrils  daily.   [DISCONTINUED] indomethacin  (INDOCIN ) 25 MG capsule Take 1 capsule (25 mg total) by mouth 3 (three) times daily with meals. (Patient not taking: Reported on 12/07/2023)   [DISCONTINUED] indomethacin  (INDOCIN ) 50 MG capsule Take 1 capsule (50 mg total) by mouth 3 (three) times daily with meals. For first 4 days then following days take 25mg  capsules as prescribed. (Patient not taking: Reported on 12/07/2023)   [DISCONTINUED] methylPREDNISolone  (MEDROL  DOSEPAK) 4 MG TBPK tablet Take as directed (Patient not taking: Reported on 12/07/2023)   [DISCONTINUED] naltrexone  (DEPADE) 50 MG tablet Take 1 tablet (50 mg total) by mouth daily. (Patient not taking: Reported on 12/07/2023)   [DISCONTINUED] nicotine  (NICODERM CQ ) 14 mg/24hr patch Place 1 patch (14 mg total) onto the skin daily. For 2 weeks   [DISCONTINUED] nicotine  (NICODERM CQ ) 7 mg/24hr patch Place 1 patch (7 mg total) onto the skin daily. For 2 weeks   [DISCONTINUED] omeprazole  (PRILOSEC  OTC) 20 MG tablet Take 1 tablet (20 mg total) by mouth daily for 14 days. (Patient not taking: Reported on 12/07/2023)   [DISCONTINUED] varenicline  (CHANTIX ) 1 MG tablet TAKE AS DIRECTED IN THE PACKAGE.   Facility-Administered Encounter Medications as of 03/07/2024  Medication   albuterol  (PROVENTIL ) (2.5 MG/3ML) 0.083% nebulizer solution 2.5 mg      Medical History: Past Medical History:  Diagnosis Date   Gout    Hypertension  Vital Signs: BP (!) 123/105   Pulse 66   Temp 98.2 F (36.8 C)   Ht 6' (1.829 m)   Wt 199 lb (90.3 kg)   SpO2 97%   BMI 26.99 kg/m    Review of Systems  Skin:  Positive for wound.    Physical Exam Vitals reviewed.  Constitutional:      Appearance: Normal appearance.  Musculoskeletal:       Hands:     Comments: Surgical wound, well approximated.  Neurological:     Mental Status: He is alert.    Assessment/Plan: 1. Visit for wound check (Primary) Wound appears well.  Follow up via MyChart messenger  if symptoms fail to improve or may return to clinic as needed for worsening symptoms.       General Counseling: Susanne Epps understanding of the findings of todays visit and agrees with plan of treatment. I have discussed any further diagnostic evaluation that may be needed or ordered today. We also reviewed his medications today. he has been encouraged to call the office with any questions or concerns that should arise related to todays visit.   No orders of the defined types were placed in this encounter.   No orders of the defined types were placed in this encounter.   Time spent:15 Minutes    Sheria Dills AGNP-C Nurse Practitioner

## 2024-03-18 ENCOUNTER — Ambulatory Visit: Admitting: Pulmonary Disease

## 2024-03-26 ENCOUNTER — Other Ambulatory Visit: Payer: Self-pay

## 2024-03-26 MED ORDER — BREZTRI AEROSPHERE 160-9-4.8 MCG/ACT IN AERO: 2.0000 | INHALATION_SPRAY | Freq: Two times a day (BID) | RESPIRATORY_TRACT | 0 refills | Status: DC

## 2024-04-14 ENCOUNTER — Other Ambulatory Visit: Payer: Self-pay

## 2024-04-14 ENCOUNTER — Encounter: Payer: Self-pay | Admitting: Emergency Medicine

## 2024-04-14 DIAGNOSIS — I10 Essential (primary) hypertension: Secondary | ICD-10-CM | POA: Diagnosis not present

## 2024-04-14 DIAGNOSIS — R339 Retention of urine, unspecified: Secondary | ICD-10-CM | POA: Insufficient documentation

## 2024-04-14 DIAGNOSIS — E876 Hypokalemia: Secondary | ICD-10-CM | POA: Insufficient documentation

## 2024-04-14 DIAGNOSIS — R935 Abnormal findings on diagnostic imaging of other abdominal regions, including retroperitoneum: Secondary | ICD-10-CM | POA: Diagnosis not present

## 2024-04-14 DIAGNOSIS — N401 Enlarged prostate with lower urinary tract symptoms: Secondary | ICD-10-CM | POA: Diagnosis not present

## 2024-04-14 DIAGNOSIS — D72829 Elevated white blood cell count, unspecified: Secondary | ICD-10-CM | POA: Insufficient documentation

## 2024-04-14 DIAGNOSIS — N3001 Acute cystitis with hematuria: Secondary | ICD-10-CM | POA: Insufficient documentation

## 2024-04-14 DIAGNOSIS — R300 Dysuria: Secondary | ICD-10-CM | POA: Diagnosis not present

## 2024-04-14 LAB — BASIC METABOLIC PANEL WITH GFR
Anion gap: 10 (ref 5–15)
BUN: 13 mg/dL (ref 6–20)
CO2: 19 mmol/L — ABNORMAL LOW (ref 22–32)
Calcium: 8.8 mg/dL — ABNORMAL LOW (ref 8.9–10.3)
Chloride: 107 mmol/L (ref 98–111)
Creatinine, Ser: 1.22 mg/dL (ref 0.61–1.24)
GFR, Estimated: >60 mL/min (ref >60)
Glucose, Bld: 101 mg/dL — ABNORMAL HIGH (ref 70–99)
Potassium: 3.2 mmol/L — ABNORMAL LOW (ref 3.5–5.1)
Sodium: 136 mmol/L (ref 135–145)

## 2024-04-14 LAB — CBC
HCT: 37.3 % — ABNORMAL LOW (ref 39.0–52.0)
Hemoglobin: 13.3 g/dL (ref 13.0–17.0)
MCH: 32.7 pg (ref 26.0–34.0)
MCHC: 35.7 g/dL (ref 30.0–36.0)
MCV: 91.6 fL (ref 80.0–100.0)
Platelets: 214 10*3/uL (ref 150–400)
RBC: 4.07 MIL/uL — ABNORMAL LOW (ref 4.22–5.81)
RDW: 13.2 % (ref 11.5–15.5)
WBC: 17.1 10*3/uL — ABNORMAL HIGH (ref 4.0–10.5)
nRBC: 0 % (ref 0.0–0.2)

## 2024-04-14 LAB — URINALYSIS, ROUTINE W REFLEX MICROSCOPIC
Bilirubin Urine: NEGATIVE
Glucose, UA: NEGATIVE mg/dL
Ketones, ur: NEGATIVE mg/dL
Nitrite: NEGATIVE
Protein, ur: NEGATIVE mg/dL
Specific Gravity, Urine: 1.002 — ABNORMAL LOW (ref 1.005–1.030)
Squamous Epithelial / HPF: 0 /HPF (ref 0–5)
pH: 6 (ref 5.0–8.0)

## 2024-04-14 MED ORDER — OXYCODONE-ACETAMINOPHEN 5-325 MG PO TABS
1.0000 | ORAL_TABLET | Freq: Once | ORAL | Status: AC
  Administered 2024-04-15: 1 via ORAL
  Filled 2024-04-14: qty 1

## 2024-04-14 NOTE — ED Triage Notes (Signed)
 Pt arrived via POV with reports of difficulty urinating, states sxs started today, unable to pee, hx of kidney stones years ago, c/o chills and subjective fevers. Pt is able to get a few drops of urine out when he does go.   Denies any back pain.

## 2024-04-14 NOTE — ED Notes (Signed)
 Bladder scan showed in triage.

## 2024-04-15 ENCOUNTER — Emergency Department

## 2024-04-15 ENCOUNTER — Emergency Department
Admission: EM | Admit: 2024-04-15 | Discharge: 2024-04-15 | Disposition: A | Discharge status: Home / Self Care | Attending: Emergency Medicine | Admitting: Emergency Medicine

## 2024-04-15 DIAGNOSIS — R339 Retention of urine, unspecified: Secondary | ICD-10-CM

## 2024-04-15 DIAGNOSIS — N401 Enlarged prostate with lower urinary tract symptoms: Secondary | ICD-10-CM | POA: Diagnosis not present

## 2024-04-15 DIAGNOSIS — N3001 Acute cystitis with hematuria: Secondary | ICD-10-CM

## 2024-04-15 DIAGNOSIS — R935 Abnormal findings on diagnostic imaging of other abdominal regions, including retroperitoneum: Secondary | ICD-10-CM | POA: Diagnosis not present

## 2024-04-15 LAB — CHLAMYDIA/NGC RT PCR (ARMC ONLY)
Chlamydia Tr: NOT DETECTED
N gonorrhoeae: NOT DETECTED

## 2024-04-15 MED ORDER — IOHEXOL 300 MG/ML  SOLN
100.0000 mL | Freq: Once | INTRAMUSCULAR | Status: AC | PRN
  Administered 2024-04-15: 100 mL via INTRAVENOUS

## 2024-04-15 MED ORDER — SODIUM CHLORIDE 0.9 % IV SOLN
1.0000 g | Freq: Once | INTRAVENOUS | Status: AC
  Administered 2024-04-15: 1 g via INTRAVENOUS
  Filled 2024-04-15: qty 10

## 2024-04-15 MED ORDER — TAMSULOSIN HCL 0.4 MG PO CAPS: 0.4000 mg | ORAL_CAPSULE | Freq: Every day | ORAL | 0 refills | Status: DC

## 2024-04-15 MED ORDER — CEFUROXIME AXETIL 500 MG PO TABS: 500.0000 mg | ORAL_TABLET | Freq: Two times a day (BID) | ORAL | 0 refills | Status: AC

## 2024-04-15 NOTE — ED Notes (Signed)
 Pt returns to STAT desk c/o persistent and increasing bladder discomfort; pt voices understanding of foley insertion and taken to triage for procedure

## 2024-04-15 NOTE — ED Notes (Signed)
 Pt declines pain med at present; instr on plan of care and IV inserted in prep for CT scan

## 2024-04-15 NOTE — Discharge Instructions (Signed)
 You have an infection in your urine which contributed to your abdominal pain symptoms.  I suspect he also had a bladder outlet obstruction possibly from enlarged prostate.  You will keep the Foley catheter in until you follow-up with urology.  They should give you a call in the next couple of days to set up this appointment but if you have not heard from them, I have provided a phone number that you can call their clinic to set this up.  Please keep the Foley catheter in until that time.  I have sent antibiotics to your pharmacy for you to take as prescribed as well as a medication to potentially help with improved flow through your prostate and bladder.  Please return for any worsening or severe symptoms.

## 2024-04-15 NOTE — ED Notes (Signed)
 Pt reports persistent bladder discomfort; ret to triage for reassessment; pt agrees to taking pain med now; bladder rescanned and indicates urine

## 2024-04-15 NOTE — ED Provider Notes (Signed)
 [facility] Provider Note    Event Date/Time   First MD Initiated Contact with Patient 04/15/24 [phone]     (approximate)   History   Dysuria and Urinary Retention   HPI Derek [name] is a 58 y.o. male with history of gout, HTN, HLD presenting today for dysuria and urinary retention.  Patient states over the past 24 hours he has had difficulty urinating with lower abdominal pain.  Denies any fevers, back pain, nausea, vomiting, diarrhea, constipation.  Has pain when he is trying to urinate but no hematuria.  Prior history of kidney stones.  Denies any history of enlarged prostate.     Physical Exam   Triage Vital Signs: ED Triage Vitals  Encounter Vitals Group     BP 04/14/24 2210 135/67     Girls Systolic BP Percentile --      Girls Diastolic BP Percentile --      Boys Systolic BP Percentile --      Boys Diastolic BP Percentile --      Pulse Rate 04/14/24 2210 (!) 108     Resp 04/14/24 2210 18     Temp 04/14/24 2210 99.9 F (37.7 C)     Temp Source 04/14/24 2210 Oral     SpO2 04/14/24 2210 95 %     Weight 04/14/24 2209 196 lb (88.9 kg)     Height 04/14/24 2209 6' (1.829 m)     Head Circumference --      Peak Flow --      Pain Score 04/14/24 2209 8     Pain Loc --      Pain Education --      Exclude from Growth Chart --     Most recent vital signs: Vitals:   04/15/24 0102 04/15/24 0328  BP: (!) 151/105 (!) 144/81  Pulse: (!) 102 79  Resp: 18 18  Temp: 98.8 F (37.1 C)   SpO2: 97% 97%   Physical Exam: I have reviewed the vital signs and nursing notes. General: Awake, alert, no acute distress.  Nontoxic appearing. Head:  Atraumatic, normocephalic.   ENT:  EOM intact, PERRL. Oral mucosa is pink and moist with no lesions. Neck: Neck is supple with full range of motion, No meningeal signs. Cardiovascular:  RRR, No murmurs. Peripheral pulses palpable and equal bilaterally. Respiratory:  Symmetrical chest wall expansion.  No rhonchi, rales,  or wheezes.  Good air movement throughout.  No use of accessory muscles.   Musculoskeletal:  No cyanosis or edema. Moving extremities with full ROM Abdomen:  Soft, nontender, nondistended.  No CVA tenderness bilaterally Neuro:  GCS 15, moving all four extremities, interacting appropriately. Speech clear. Psych:  Calm, appropriate.   Skin:  Warm, dry, no rash.    ED Results / Procedures / Treatments   Labs (all labs ordered are listed, but only abnormal results are displayed) Labs Reviewed  URINALYSIS, ROUTINE W REFLEX MICROSCOPIC - Abnormal; Notable for the following components:      Result Value   Color, Urine STRAW (*)    APPearance HAZY (*)    Specific Gravity, Urine 1.002 (*)    Hgb urine dipstick MODERATE (*)    Leukocytes,Ua LARGE (*)    Bacteria, UA FEW (*)    All other components within normal limits  CBC - Abnormal; Notable for the following components:   WBC 17.1 (*)    RBC 4.07 (*)    HCT 37.3 (*)    All other components  within normal limits  BASIC METABOLIC PANEL WITH GFR - Abnormal; Notable for the following components:   Potassium 3.2 (*)    CO2 19 (*)    Glucose, Bld 101 (*)    Calcium 8.8 (*)    All other components within normal limits  CHLAMYDIA/NGC RT PCR ([facility] ONLY)            URINE CULTURE     EKG    RADIOLOGY Independently interpreted CT abdomen/pelvis with no acute pathology   PROCEDURES:  Critical Care performed: No  Procedures   MEDICATIONS ORDERED IN ED: Medications  cefTRIAXone (ROCEPHIN) 1 g in sodium chloride  0.9 % 100 mL IVPB (1 g Intravenous New Bag/Given 04/15/24 0355)  oxyCODONE -acetaminophen  (PERCOCET/ROXICET) 5-325 MG per tablet 1 tablet (1 tablet Oral Given 04/15/24 0102)  iohexol  (OMNIPAQUE ) 300 MG/ML solution 100 mL (100 mLs Intravenous Contrast Given 04/15/24 0025)     IMPRESSION / MDM / ASSESSMENT AND PLAN / ED COURSE  I reviewed the triage vital signs and the nursing notes.                               Differential diagnosis includes, but is not limited to, cystitis, pyelonephritis, BPH with bladder outlet obstruction, nephrolithiasis, lower concern for colitis or enteritis or other acute intra-abdominal infections  Patient's presentation is most consistent with acute complicated illness / injury requiring diagnostic workup.  Patient is a 58 year old male presenting today for dysuria and urinary retention.  While patient was in triage, had multiple bladder scans with greater than 350 cc retention.  Foley catheter was placed with immediate improvement in lower abdominal pain symptoms and complete resolution.  Vital signs normalized without issue.  White blood cell count of 17.  UA positive for UTI.  BMP otherwise largely reassuring.  CT imaging was ordered which shows no other acute intra-abdominal pathology like kidney stones or pyelonephritis.  On my exam in the room, he has no abdominal tenderness or CVA tenderness bilaterally.  Suspect likely acute cystitis leading to urinary retention with potential BPH.  He is otherwise stable and not septic at this time.  Will discharge with Foley catheter in place.  Given dose of ceftriaxone here for initial antibiotic treatment and discharged on Ceftin.  Also given Flomax.  Will have follow-up set up with urology for reassessment in the next week.  Given strict return precautions and agreeable with plan.     FINAL CLINICAL IMPRESSION(S) / ED DIAGNOSES   Final diagnoses:  Acute cystitis with hematuria  Urinary retention     Rx / DC Orders   ED Discharge Orders          Ordered    cefUROXime (CEFTIN) 500 MG tablet  2 times daily with meals        04/15/24 0353    tamsulosin (FLOMAX) 0.4 MG CAPS capsule  Daily        04/15/24 0353    Ambulatory referral to Urology        04/15/24 0353             Note:  This document was prepared using Dragon voice recognition software and may include unintentional dictation errors.   Malvina Alm DASEN,  MD 04/15/24 [phone]

## 2024-04-15 NOTE — ED Notes (Signed)
 Pt reports relief of discomfort

## 2024-04-16 DIAGNOSIS — N390 Urinary tract infection, site not specified: Secondary | ICD-10-CM | POA: Diagnosis not present

## 2024-04-16 DIAGNOSIS — R102 Pelvic and perineal pain: Secondary | ICD-10-CM | POA: Diagnosis not present

## 2024-04-16 LAB — URINE CULTURE: Culture: NO GROWTH

## 2024-05-08 ENCOUNTER — Other Ambulatory Visit: Payer: Self-pay

## 2024-05-08 ENCOUNTER — Ambulatory Visit: Admitting: Pulmonary Disease

## 2024-05-08 ENCOUNTER — Encounter: Payer: Self-pay | Admitting: Pulmonary Disease

## 2024-05-08 VITALS — BP 130/88 | HR 70 | Temp 98.5°F | Ht 72.0 in | Wt 192.0 lb

## 2024-05-08 DIAGNOSIS — J4489 Other specified chronic obstructive pulmonary disease: Secondary | ICD-10-CM | POA: Diagnosis not present

## 2024-05-08 DIAGNOSIS — Z87891 Personal history of nicotine dependence: Secondary | ICD-10-CM

## 2024-05-08 DIAGNOSIS — F17210 Nicotine dependence, cigarettes, uncomplicated: Secondary | ICD-10-CM

## 2024-05-08 DIAGNOSIS — Z122 Encounter for screening for malignant neoplasm of respiratory organs: Secondary | ICD-10-CM

## 2024-05-08 NOTE — Progress Notes (Signed)
 Subjective:    Patient ID: Derek [name], male    DOB: [birth year], 58 y.o.   MRN: [id number]  Patient Care Team: Cyrus Selinda Moose, PA-C as PCP - General (Family Medicine) Darliss Rogue, MD as PCP - Cardiology (Cardiology) Tamea Dedra CROME, MD as Consulting Physician (Pulmonary Disease)  Chief Complaint  Patient presents with   Follow-up    BACKGROUND/INTERVAL:Patient is a 58 year old current smoker (8 cigs/day,40 PY) who presents for follow-up on the issue of COPD and shortness of breath.  This is a scheduled visit.  I last saw the patient on 30 January 2023.   HPI Discussed the use of AI scribe software for clinical note transcription with the patient, who gave verbal consent to proceed.  History of Present Illness   Derek [name] is a 58 year old male with asthma-COPD overlap who presents for follow-up of his respiratory condition.  His breathing is stable with the use of Breztri , which he finds effective. He has run out of the medication and requires a coupon to obtain more. He uses his rescue inhaler, levalbuterol , only rarely.  Despite continuing to smoke, he is attempting to reduce his cigarette use. 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   He has not had any cough or sputum production lately.  No wheezes.  No fevers, chills or sweats.  Overall he feels well and looks well.     DATA 05/11/2020 PFTs: FEV1 3.71 L or 78% predicted, FVC 4.81 L or 76% predicted, FEV1/FVC 77%.  No significant bronchodilator response.  Lung volumes at low end of normal, diffusion capacity mildly impaired. 02/22/2022 LDCT chest: Lung RADS 1, negative. 01/23/2023 PFTs: FEV1 2.23 L or 61% predicted, FVC 3.42 L or 72% predicted, FEV1/FVC 65%.  There is a significant bronchodilator response with FEV1 improving to 2.78 L (24% net change) postbronchodilator, this is 76% of predicted.  Lung volumes show air  trapping, diffusion capacity is mildly reduced.  This is consistent with moderate obstructive airways disease with reversible component.    Review of Systems A 10 point review of systems was performed and it is as noted above otherwise negative.   Patient Active Problem List   Diagnosis Date Noted   Hyperuricemia 09/08/2022   GAD (generalized anxiety disorder) 08/01/2022   Hypertriglyceridemia 08/01/2022   Chest pain 08/01/2022   Left leg pain 08/01/2022   Hamstring muscle strain, right, initial encounter 05/02/2022   Aortic atherosclerosis (HCC) 02/24/2022   COPD (chronic obstructive pulmonary disease) (HCC) 12/16/2021   Asthma 05/16/2021   Adrenal adenoma, right 01/03/2016   Cervical nerve root disorder 01/03/2016   Displacement of cervical intervertebral disc without myelopathy 01/03/2016   Erectile dysfunction 01/03/2016   Family history of malignant neoplasm of prostate 01/03/2016   H/O arthrodesis 01/03/2016   Gout 01/03/2016   Headache, migraine 01/03/2016   Calculus of kidney 01/03/2016   Backhand tennis elbow 01/03/2016   Disordered sleep 06/23/2014   B12 deficiency 06/23/2014   Degeneration of cervical intervertebral disc 01/25/2010   Lipoma of skin 07/21/2009   Primary hypertension 11/25/2007   Awareness of heartbeats 11/25/2007   Tobacco use 11/25/2007    Social History   Tobacco Use   Smoking status: Every Day    Current packs/day: 0.50    Average packs/day: 0.5 packs/day for 36.0 years (18.0 ttl pk-yrs)    Types: Cigarettes   Smokeless tobacco: Current    Types: Snuff  Tobacco comments:    8 cigarettes daily 01/30/2023 khj  Substance Use Topics   Alcohol use: Yes    Comment: occasionally    No Known Allergies  Current Meds  Medication Sig   allopurinol  (ZYLOPRIM ) 300 MG tablet Take 1 tablet (300 mg total) by mouth daily.   amLODipine  (NORVASC ) 5 MG tablet Take 1 tablet (5 mg total) by mouth daily.   atenolol  (TENORMIN ) 50 MG tablet TAKE 1 TABLET  BY MOUTH EVERY DAY   budesonide-glycopyrrolate-formoterol (BREZTRI  AEROSPHERE) 160-9-4.8 MCG/ACT AERO inhaler Inhale 2 puffs into the lungs in the morning and at bedtime.   escitalopram (LEXAPRO) 20 MG tablet Take 20 mg by mouth daily.   fenofibrate  (TRICOR ) 145 MG tablet Take 1 tablet (145 mg total) by mouth daily.   levalbuterol  (XOPENEX  HFA) 45 MCG/ACT inhaler Inhale 2 puffs into the lungs every 6 (six) hours as needed for wheezing or shortness of breath.   sildenafil  (VIAGRA ) 100 MG tablet Take 1 tablet (100 mg total) by mouth daily as needed for erectile dysfunction.   valsartan  (DIOVAN ) 160 MG tablet Take 1 tablet (160 mg total) by mouth daily.    Immunization History  Administered Date(s) Administered   Hepatitis B 11/07/2006, 12/10/2006   Influenza,inj,Quad PF,6+ Mos 08/19/2019, 08/01/2022   Influenza-Unspecified 08/13/2018, 08/01/2021   Janssen (J&J) SARS-COV-2 Vaccination 02/02/2020   Tdap 03/05/2009, 01/08/2018        Objective:     BP 130/88 (BP Location: Left Arm, Patient Position: Sitting, Cuff Size: Normal)   Pulse 70   Temp 98.5 F (36.9 C) (Oral)   Ht 6' (1.829 m)   Wt 192 lb (87.1 kg)   SpO2 99%   BMI 26.04 kg/m   SpO2: 99 %  GENERAL: Well-developed, well-nourished gentleman, no acute distress, fully ambulatory no conversational dyspnea. HEAD: Normocephalic, atraumatic.  EYES: Pupils equal, round, reactive to light.  No scleral icterus.  MOUTH: Poor dentition, oral mucosa moist, no thrush. NECK: Supple. No thyromegaly. Trachea midline. No JVD.  No adenopathy. PULMONARY: Good air entry bilaterally.  Coarse, otherwise, no adventitious sounds. CARDIOVASCULAR: S1 and S2. Regular rate and rhythm.  No rubs, murmurs or gallops heard. ABDOMEN: Benign. MUSCULOSKELETAL: No joint deformity, no clubbing, no edema.  NEUROLOGIC: No overt focal deficit, no gait disturbance, speech is fluent. SKIN: Intact,warm,dry. PSYCH: Mood and behavior normal.         Assessment & Plan:     ICD-10-CM   1. Asthma-COPD overlap syndrome (HCC)  J44.89 Pulmonary function test    2. Tobacco dependence due to cigarettes  F17.210       Orders Placed This Encounter  Procedures   Pulmonary function test    Standing Status:   Future    Expiration Date:   05/08/2025    Where should this test be performed?:   Outpatient Pulmonary    What type of PFT is being ordered?:   Full PFT   Discussion:    Asthma-COPD overlap syndrome Asthma-COPD overlap syndrome is well-managed with Breztri . He reports rare use of levalbuterol , indicating good control. He continues to smoke but is gradually reducing cigarette use. Breathing is good, and he has not experienced significant issues despite challenging weather. Physical examination reveals clear lung sounds. - Provide coupon for Breztri  to ensure continued access to medication. - Schedule follow-up appointment in four months with pulmonary function tests (PFTs).     Tobacco dependence due to cigarettes Patient counseled regards to discontinuation of smoking.  Patient has lapsed on lung cancer  screening program, will reinstitute.   Advised if symptoms do not improve or worsen, to please contact office for sooner follow up or seek emergency care.    I spent 30 minutes of dedicated to the care of this patient on the date of this encounter to include pre-visit review of records, face-to-face time with the patient discussing conditions above, post visit ordering of testing, clinical documentation with the electronic health record, making appropriate referrals as documented, and communicating necessary findings to members of the patients care team.     C. Leita Sanders, MD Advanced Bronchoscopy [facility]    *This note was generated using voice recognition software/Dragon and/or AI transcription program.  Despite best efforts to proofread, errors can occur which can change the meaning. Any  transcriptional errors that result from this process are unintentional and may not be fully corrected at the time of dictation.

## 2024-05-08 NOTE — Patient Instructions (Signed)
 VISIT SUMMARY:  You came in today for a follow-up on your respiratory condition, specifically your asthma-COPD overlap. Your breathing is stable with your current medication, Breztri , and you rarely need to use your rescue inhaler. You are working on reducing your cigarette use, although the recent heat and humidity have been challenging.  YOUR PLAN:  -ASTHMA-COPD OVERLAP SYNDROME: Asthma-COPD overlap syndrome is a condition where you have symptoms of both asthma and chronic obstructive pulmonary disease (COPD). Your condition is well-managed with Breztri , and you rarely need to use your rescue inhaler, levalbuterol . Continue using Breztri  as prescribed, and we will provide you with a coupon to ensure you can get more. Keep working on reducing your cigarette use, as this will help your breathing. Your physical exam showed clear lung sounds, which is a good sign.  INSTRUCTIONS:  Please schedule a follow-up appointment in four months, which will include pulmonary function tests (PFTs) to assess your lung function.

## 2024-05-09 ENCOUNTER — Other Ambulatory Visit: Payer: Self-pay

## 2024-05-09 ENCOUNTER — Emergency Department
Admission: EM | Admit: 2024-05-09 | Discharge: 2024-05-09 | Disposition: A | Discharge status: Home / Self Care | Attending: Emergency Medicine | Admitting: Emergency Medicine

## 2024-05-09 DIAGNOSIS — Y828 Other medical devices associated with adverse incidents: Secondary | ICD-10-CM | POA: Diagnosis not present

## 2024-05-09 DIAGNOSIS — T83038A Leakage of other indwelling urethral catheter, initial encounter: Secondary | ICD-10-CM

## 2024-05-09 DIAGNOSIS — T83031A Leakage of indwelling urethral catheter, initial encounter: Secondary | ICD-10-CM | POA: Insufficient documentation

## 2024-05-09 NOTE — ED Provider Notes (Signed)
   [facility] Provider Note    Event Date/Time   First MD Initiated Contact with Patient 05/09/24 [phone]     (approximate)   History   Urinary Catheter Problem   HPI PER BEAGLEY is a 58 y.o. male presents today for leakage from his urinary catheter bag.  Denies any other issues with the catheter itself.  No changes in his urine.     Physical Exam   Triage Vital Signs: ED Triage Vitals [05/09/24 0522]  Encounter Vitals Group     BP (!) 147/98     Girls Systolic BP Percentile      Girls Diastolic BP Percentile      Boys Systolic BP Percentile      Boys Diastolic BP Percentile      Pulse Rate 78     Resp 18     Temp 98.2 F (36.8 C)     Temp Source Oral     SpO2 98 %     Weight      Height      Head Circumference      Peak Flow      Pain Score 0     Pain Loc      Pain Education      Exclude from Growth Chart     Most recent vital signs: Vitals:   05/09/24 0522  BP: (!) 147/98  Pulse: 78  Resp: 18  Temp: 98.2 F (36.8 C)  SpO2: 98%   Physical exam: General: no acute distress Abdomen: Catheter in place with normal flowing urine, small leakage from the bag   ED Results / Procedures / Treatments   Labs (all labs ordered are listed, but only abnormal results are displayed) Labs Reviewed - No data to display   EKG    RADIOLOGY    PROCEDURES:  Critical Care performed: No  Procedures   MEDICATIONS ORDERED IN ED: Medications - No data to display   IMPRESSION / MDM / ASSESSMENT AND PLAN / ED COURSE  I reviewed the triage vital signs and the nursing notes.                              Differential diagnosis includes, but is not limited to, catheter bag leakage  Patient's presentation is most consistent with acute, uncomplicated illness.  Patient had leakage from his Foley catheter leg bag.  It was replaced with no ongoing issues.     FINAL CLINICAL IMPRESSION(S) / ED DIAGNOSES   Final diagnoses:  Leakage  from urinary catheter, initial encounter [facility])     Rx / DC Orders   ED Discharge Orders     None        Note:  This document was prepared using Dragon voice recognition software and may include unintentional dictation errors.   Malvina Alm DASEN, MD 05/09/24 [phone]

## 2024-05-09 NOTE — ED Triage Notes (Signed)
 Patient C/O urinary catheter problem. Patient states that there is a hole in his catheter bag. Denies any pain or other symptoms, states that urine is draining as expected.

## 2024-05-19 ENCOUNTER — Ambulatory Visit: Admitting: Physician Assistant

## 2024-05-19 ENCOUNTER — Ambulatory Visit: Admitting: Urology

## 2024-05-19 VITALS — BP 147/79 | HR 65 | Ht 72.0 in | Wt 192.0 lb

## 2024-05-19 DIAGNOSIS — Z87898 Personal history of other specified conditions: Secondary | ICD-10-CM | POA: Diagnosis not present

## 2024-05-19 DIAGNOSIS — N3000 Acute cystitis without hematuria: Secondary | ICD-10-CM | POA: Diagnosis not present

## 2024-05-19 DIAGNOSIS — Z125 Encounter for screening for malignant neoplasm of prostate: Secondary | ICD-10-CM | POA: Diagnosis not present

## 2024-05-19 LAB — URINALYSIS, COMPLETE
Bilirubin, UA: NEGATIVE
Glucose, UA: NEGATIVE
Ketones, UA: NEGATIVE
Nitrite, UA: NEGATIVE
Specific Gravity, UA: 1.025 (ref 1.005–1.030)
Urobilinogen, Ur: 0.2 mg/dL (ref 0.2–1.0)
pH, UA: 6 (ref 5.0–7.5)

## 2024-05-19 LAB — MICROSCOPIC EXAMINATION: WBC, UA: >30 /HPF — AB (ref 0–5)

## 2024-05-19 LAB — BLADDER SCAN AMB NON-IMAGING

## 2024-05-19 MED ORDER — SULFAMETHOXAZOLE-TRIMETHOPRIM 800-160 MG PO TABS: 1.0000 | ORAL_TABLET | Freq: Two times a day (BID) | ORAL | 0 refills | Status: AC

## 2024-05-19 NOTE — Progress Notes (Signed)
05/19/24 9:08 AM   Derek [name] Lot 10/22/1966 [id number]  CC: Urinary retention, UTI, PSA screening  HPI: 58 year old male with family history of lethal prostate cancer in his father in his 64s who recently presented to the ED on 04/15/2024 with dysuria and inability to urinate.  Bladder scan was at that time and a Foley catheter was placed, urinalysis appeared infected but culture was negative, CT showed 47 g prostate but otherwise benign.  PSA in June 2024 was normal and stable at 1.06.  He had relief of his symptoms after catheter placement and was also treated with antibiotics.  He self removed his catheter at home about a week ago.  He has been urinating with a good stream, but still having some mild discomfort with urination and feels like he had some chills a few days ago.  PVR today normal at 0 mL.  Urinalysis appears grossly infected with greater than 30 WBC, 3-10 RBC, many bacteria, 1+ leukocyte.  Will send for culture.   PMH: Past Medical History:  Diagnosis Date   Gout    Hypertension     Surgical History: Past Surgical History:  Procedure Laterality Date   CERVICAL DISC SURGERY     COLONOSCOPY WITH PROPOFOL  N/A 02/18/2018   Procedure: COLONOSCOPY WITH PROPOFOL ;  Surgeon: Unk Corinn Skiff, MD;  Location: [facility] ENDOSCOPY;  Service: Gastroenterology;  Laterality: N/A;   TONSILLECTOMY AND ADENOIDECTOMY        Family History: Family History  Problem Relation Age of Onset   Fibromyalgia Mother    Prostate cancer Father    Hypertension Father    Healthy Sister     Social History:  reports that he has been smoking cigarettes. He has a 18 pack-year smoking history. His smokeless tobacco use includes snuff. He reports current alcohol use. He reports that he does not use drugs.  Physical Exam: BP (!) 147/79 (BP Location: Left Arm, Patient Position: Sitting, Cuff Size: Normal)   Pulse 65   Ht 6' (1.829 m)   Wt 192 lb (87.1 kg)   SpO2 98%   BMI 26.04 kg/m     Constitutional:  Alert and oriented, No acute distress. Cardiovascular: No clubbing, cyanosis, or edema. Respiratory: Normal respiratory effort, no increased work of breathing. GI: Abdomen is soft, nontender, nondistended, no abdominal masses  Laboratory Data: Reviewed, see HPI  Pertinent Imaging: I have personally viewed and interpreted the CT scan dated 04/11/2024 showing a 47 g prostate, no hydronephrosis or stones.  Assessment & Plan:   58 year old male with lethal family history of prostate cancer in his father in his 83s recently seen in the ER in mid June for urinary retention and UTI.  He self removed his catheter at home, and has been voiding well since then with normal PVR of 0ml today, however has UTI symptoms with urinalysis that appears grossly infected.  I offered a trial of Flomax  but he would like to hold off at this time and treat the infection first which I think is reasonable.  We discussed the importance of waiting at least a few months before rechecking a PSA in the setting of his recent catheter and infection.  Urine sent for culture, Bactrim  DS twice daily x 2 weeks RTC 4 weeks UA IPSS PVR and symptom check Consider Flomax  if worsening urinary symptoms Will need PSA reflex to free in 3 to 4 months  Redell Burnet, MD 05/19/2024  [facility] [address], [address] [place], [place] [address] [phone]   

## 2024-05-19 NOTE — Patient Instructions (Signed)
 Prostate Cancer Screening  Prostate cancer screening is testing that is done to check for the presence of prostate cancer in men. The prostate gland is a walnut-sized gland that is located below the bladder and in front of the rectum in males. The function of the prostate is to add fluid to semen during ejaculation. Prostate cancer is one of the most common types of cancer in men. Who should have prostate cancer screening? Screening recommendations vary based on age and other risk factors, as well as between the professional organizations who make the recommendations. In general, screening is recommended if: You are age 7 to 100 and have an average risk for prostate cancer. You should talk with your health care provider about your need for screening and how often screening should be done. Because most prostate cancers are slow growing and will not cause death, screening in this age group is generally reserved for men who have a 10- to 15-year life expectancy. You are younger than age 78, and you have these risk factors: Having a father, brother, or uncle who has been diagnosed with prostate cancer. The risk is higher if your family member's cancer occurred at an early age or if you have multiple family members with prostate cancer at an early age. Being a male who is [place] or is of [place] or sub-Saharan African descent. In general, screening is not recommended if: You are younger than age 79. You are between the ages of 36 and 45 and you have no risk factors. You are 39 years of age or older. At this age, the risks that screening can cause are greater than the benefits that it may provide. If you are at high risk for prostate cancer, your health care provider may recommend that you have screenings more often or that you start screening at a younger age. How is screening for prostate cancer done? The recommended prostate cancer screening test is a blood test called the prostate-specific antigen  (PSA) test. PSA is a protein that is made in the prostate. As you age, your prostate naturally produces more PSA. Abnormally high PSA levels may be caused by: Prostate cancer. An enlarged prostate that is not caused by cancer (benign prostatic hyperplasia, or BPH). This condition is very common in older men. A prostate gland infection (prostatitis) or urinary tract infection. Certain medicines such as male hormones (like testosterone) or other medicines that raise testosterone levels. A rectal exam may be done as part of prostate cancer screening to help provide information about the size of your prostate gland. When a rectal exam is performed, it should be done after the PSA level is drawn to avoid any effect on the results. Depending on the PSA results, you may need more tests, such as:  Blood and imaging tests. A procedure to remove tissue samples from your prostate gland for testing (biopsy). This is the only way to know for certain if you have prostate cancer. What are the benefits of prostate cancer screening? Screening can help to identify cancer at an early stage, before symptoms start and when the cancer can be treated more easily. There is a small chance that screening may lower your risk of dying from prostate cancer. The chance is small because prostate cancer is a slow-growing cancer, and most men with prostate cancer die from a different cause. What are the risks of prostate cancer screening? The main risk of prostate cancer screening is diagnosing and treating prostate cancer that would never have caused  any symptoms or problems. This is called overdiagnosisand overtreatment. PSA screening cannot tell you if your PSA is high due to cancer or a different cause. A prostate biopsy is the only procedure to diagnose prostate cancer. Even the results of a biopsy may not tell you if your cancer needs to be treated. Slow-growing prostate cancer may not need any treatment other than monitoring,  so diagnosing and treating it may cause unnecessary stress or other side effects. Questions to ask your health care provider When should I start prostate cancer screening? What is my risk for prostate cancer? How often do I need screening? What type of screening tests do I need? How do I get my test results? What do my results mean? Do I need treatment? Where to find more information The American Cancer Society: www.cancer.org American Urological Association: www.auanet.org Contact a health care provider if: You have difficulty urinating. You have pain when you urinate or ejaculate. You have blood in your urine or semen. You have pain in your back or in the area of your prostate. Summary Prostate cancer is a common type of cancer in men. The prostate gland is located below the bladder and in front of the rectum. This gland adds fluid to semen during ejaculation. Prostate cancer screening may identify cancer at an early stage, when the cancer can be treated more easily and is less likely to have spread to other areas of the body. The prostate-specific antigen (PSA) test is the recommended screening test for prostate cancer, but it has associated risks. Discuss the risks and benefits of prostate cancer screening with your health care provider. If you are age 23 or older, the risks that screening can cause are greater than the benefits that it may provide. This information is not intended to replace advice given to you by your health care provider. Make sure you discuss any questions you have with your health care provider. Document Revised: 04/04/2021 Document Reviewed: 04/04/2021 Elsevier Patient Education  2024 [employer].

## 2024-05-20 ENCOUNTER — Other Ambulatory Visit: Payer: Self-pay | Admitting: Cardiology

## 2024-05-20 DIAGNOSIS — M1A9XX0 Chronic gout, unspecified, without tophus (tophi): Secondary | ICD-10-CM | POA: Diagnosis not present

## 2024-05-20 DIAGNOSIS — I10 Essential (primary) hypertension: Secondary | ICD-10-CM | POA: Diagnosis not present

## 2024-05-20 DIAGNOSIS — J449 Chronic obstructive pulmonary disease, unspecified: Secondary | ICD-10-CM | POA: Diagnosis not present

## 2024-05-20 DIAGNOSIS — E781 Pure hyperglyceridemia: Secondary | ICD-10-CM | POA: Diagnosis not present

## 2024-05-20 DIAGNOSIS — I700 Atherosclerosis of aorta: Secondary | ICD-10-CM | POA: Diagnosis not present

## 2024-05-21 LAB — CULTURE, URINE COMPREHENSIVE

## 2024-05-27 ENCOUNTER — Ambulatory Visit
Admission: RE | Admit: 2024-05-27 | Discharge: 2024-05-27 | Disposition: A | Discharge status: Home / Self Care | Source: Ambulatory Visit | Attending: Acute Care | Admitting: Acute Care

## 2024-05-27 DIAGNOSIS — Z122 Encounter for screening for malignant neoplasm of respiratory organs: Secondary | ICD-10-CM | POA: Insufficient documentation

## 2024-05-27 DIAGNOSIS — Z87891 Personal history of nicotine dependence: Secondary | ICD-10-CM | POA: Diagnosis not present

## 2024-05-27 DIAGNOSIS — F17210 Nicotine dependence, cigarettes, uncomplicated: Secondary | ICD-10-CM | POA: Diagnosis not present

## 2024-06-09 ENCOUNTER — Other Ambulatory Visit: Payer: Self-pay

## 2024-06-09 DIAGNOSIS — Z87891 Personal history of nicotine dependence: Secondary | ICD-10-CM

## 2024-06-09 DIAGNOSIS — F17210 Nicotine dependence, cigarettes, uncomplicated: Secondary | ICD-10-CM

## 2024-06-09 DIAGNOSIS — Z122 Encounter for screening for malignant neoplasm of respiratory organs: Secondary | ICD-10-CM

## 2024-06-25 ENCOUNTER — Ambulatory Visit: Admitting: Urology

## 2024-06-25 DIAGNOSIS — Z87898 Personal history of other specified conditions: Secondary | ICD-10-CM

## 2024-06-26 DIAGNOSIS — M19041 Primary osteoarthritis, right hand: Secondary | ICD-10-CM | POA: Diagnosis not present

## 2024-06-27 ENCOUNTER — Other Ambulatory Visit: Payer: Self-pay | Admitting: Pulmonary Disease

## 2024-09-04 ENCOUNTER — Encounter

## 2024-09-04 ENCOUNTER — Ambulatory Visit: Admitting: Pulmonary Disease

## 2024-09-04 ENCOUNTER — Encounter: Payer: Self-pay | Admitting: Pulmonary Disease

## 2024-09-08 ENCOUNTER — Ambulatory Visit: Admitting: Pulmonary Disease

## 2024-09-08 ENCOUNTER — Encounter

## 2024-09-15 ENCOUNTER — Other Ambulatory Visit: Payer: Self-pay | Admitting: Pulmonary Disease

## 2024-11-11 ENCOUNTER — Other Ambulatory Visit: Payer: Self-pay | Admitting: Cardiology

## 2024-11-11 DIAGNOSIS — I10 Essential (primary) hypertension: Secondary | ICD-10-CM
